# Patient Record
Sex: Male | Born: 1955 | Race: White | Hispanic: No | Marital: Married | Smoking: Never smoker
Health system: Southern US, Community
[De-identification: ages and names within clinical notes are randomized; demographics above are authoritative.]

## PROBLEM LIST (undated history)

## (undated) DIAGNOSIS — I2510 Atherosclerotic heart disease of native coronary artery without angina pectoris: Secondary | ICD-10-CM

## (undated) DIAGNOSIS — I219 Acute myocardial infarction, unspecified: Secondary | ICD-10-CM

## (undated) DIAGNOSIS — E785 Hyperlipidemia, unspecified: Secondary | ICD-10-CM

## (undated) DIAGNOSIS — I519 Heart disease, unspecified: Secondary | ICD-10-CM

## (undated) HISTORY — PX: HERNIA REPAIR: SHX51

## (undated) HISTORY — DX: Acute myocardial infarction, unspecified: I21.9

---

## 1999-09-23 ENCOUNTER — Encounter: Payer: Self-pay | Admitting: Emergency Medicine

## 1999-09-23 ENCOUNTER — Emergency Department (HOSPITAL_COMMUNITY): Admission: EM | Admit: 1999-09-23 | Discharge: 1999-09-23 | Payer: Self-pay | Admitting: Emergency Medicine

## 2015-09-19 ENCOUNTER — Emergency Department (HOSPITAL_COMMUNITY): Payer: BLUE CROSS/BLUE SHIELD

## 2015-09-19 ENCOUNTER — Encounter (HOSPITAL_COMMUNITY)
Admission: EM | Disposition: A | Discharge status: Home / Self Care | Payer: Self-pay | Source: Home / Self Care | Attending: Cardiovascular Disease

## 2015-09-19 ENCOUNTER — Encounter (HOSPITAL_COMMUNITY): Payer: Self-pay | Admitting: Emergency Medicine

## 2015-09-19 ENCOUNTER — Other Ambulatory Visit: Payer: Self-pay

## 2015-09-19 ENCOUNTER — Inpatient Hospital Stay (HOSPITAL_COMMUNITY)
Admission: EM | Admit: 2015-09-19 | Discharge: 2015-09-21 | DRG: 247 | Disposition: A | Discharge status: Home / Self Care | Payer: BLUE CROSS/BLUE SHIELD | Attending: Cardiovascular Disease | Admitting: Cardiovascular Disease

## 2015-09-19 ENCOUNTER — Other Ambulatory Visit (HOSPITAL_COMMUNITY): Payer: BLUE CROSS/BLUE SHIELD

## 2015-09-19 DIAGNOSIS — Z823 Family history of stroke: Secondary | ICD-10-CM | POA: Diagnosis not present

## 2015-09-19 DIAGNOSIS — Z8249 Family history of ischemic heart disease and other diseases of the circulatory system: Secondary | ICD-10-CM | POA: Diagnosis not present

## 2015-09-19 DIAGNOSIS — I519 Heart disease, unspecified: Secondary | ICD-10-CM | POA: Diagnosis present

## 2015-09-19 DIAGNOSIS — I2102 ST elevation (STEMI) myocardial infarction involving left anterior descending coronary artery: Secondary | ICD-10-CM | POA: Diagnosis present

## 2015-09-19 DIAGNOSIS — E785 Hyperlipidemia, unspecified: Secondary | ICD-10-CM | POA: Diagnosis present

## 2015-09-19 DIAGNOSIS — I2510 Atherosclerotic heart disease of native coronary artery without angina pectoris: Secondary | ICD-10-CM | POA: Diagnosis present

## 2015-09-19 DIAGNOSIS — R079 Chest pain, unspecified: Secondary | ICD-10-CM | POA: Diagnosis present

## 2015-09-19 DIAGNOSIS — I213 ST elevation (STEMI) myocardial infarction of unspecified site: Secondary | ICD-10-CM

## 2015-09-19 HISTORY — DX: Heart disease, unspecified: I51.9

## 2015-09-19 HISTORY — DX: Hyperlipidemia, unspecified: E78.5

## 2015-09-19 HISTORY — DX: Atherosclerotic heart disease of native coronary artery without angina pectoris: I25.10

## 2015-09-19 HISTORY — PX: CARDIAC CATHETERIZATION: SHX172

## 2015-09-19 LAB — BASIC METABOLIC PANEL
ANION GAP: 10 (ref 5–15)
BUN: 17 mg/dL (ref 6–20)
CHLORIDE: 105 mmol/L (ref 101–111)
CO2: 26 mmol/L (ref 22–32)
Calcium: 10 mg/dL (ref 8.9–10.3)
Creatinine, Ser: 0.97 mg/dL (ref 0.61–1.24)
GFR calc Af Amer: >60 mL/min (ref >60)
GLUCOSE: 115 mg/dL — AB (ref 65–99)
POTASSIUM: 3.7 mmol/L (ref 3.5–5.1)
Sodium: 141 mmol/L (ref 135–145)

## 2015-09-19 LAB — I-STAT CHEM 8, ED
BUN: 22 mg/dL — AB (ref 6–20)
CHLORIDE: 107 mmol/L (ref 101–111)
CREATININE: 0.9 mg/dL (ref 0.61–1.24)
Calcium, Ion: 1.07 mmol/L — ABNORMAL LOW (ref 1.12–1.23)
GLUCOSE: 112 mg/dL — AB (ref 65–99)
HEMATOCRIT: 49 % (ref 39.0–52.0)
HEMOGLOBIN: 16.7 g/dL (ref 13.0–17.0)
POTASSIUM: 5 mmol/L (ref 3.5–5.1)
Sodium: 138 mmol/L (ref 135–145)
TCO2: 23 mmol/L (ref 0–100)

## 2015-09-19 LAB — CBC WITH DIFFERENTIAL/PLATELET
BASOS ABS: 0.1 10*3/uL (ref 0.0–0.1)
Basophils Relative: 1 %
EOS PCT: 3 %
Eosinophils Absolute: 0.3 10*3/uL (ref 0.0–0.7)
HEMATOCRIT: 45.1 % (ref 39.0–52.0)
Hemoglobin: 16.4 g/dL (ref 13.0–17.0)
LYMPHS ABS: 3.1 10*3/uL (ref 0.7–4.0)
LYMPHS PCT: 41 %
MCH: 34.7 pg — AB (ref 26.0–34.0)
MCHC: 36.4 g/dL — ABNORMAL HIGH (ref 30.0–36.0)
MCV: 95.3 fL (ref 78.0–100.0)
Monocytes Absolute: 0.6 10*3/uL (ref 0.1–1.0)
Monocytes Relative: 7 %
NEUTROS ABS: 3.6 10*3/uL (ref 1.7–7.7)
Neutrophils Relative %: 48 %
PLATELETS: 221 10*3/uL (ref 150–400)
RBC: 4.73 MIL/uL (ref 4.22–5.81)
RDW: 12.7 % (ref 11.5–15.5)
WBC: 7.6 10*3/uL (ref 4.0–10.5)

## 2015-09-19 LAB — MRSA PCR SCREENING: MRSA by PCR: NEGATIVE

## 2015-09-19 LAB — TROPONIN I: Troponin I: <0.03 ng/mL (ref <0.031)

## 2015-09-19 LAB — I-STAT TROPONIN, ED: TROPONIN I, POC: 0.01 ng/mL (ref 0.00–0.08)

## 2015-09-19 LAB — PROTIME-INR
INR: 1.13 (ref 0.00–1.49)
Prothrombin Time: 14.7 seconds (ref 11.6–15.2)

## 2015-09-19 LAB — APTT: APTT: 25 s (ref 24–37)

## 2015-09-19 SURGERY — LEFT HEART CATH AND CORONARY ANGIOGRAPHY

## 2015-09-19 MED ORDER — ASPIRIN 81 MG PO CHEW
324.0000 mg | CHEWABLE_TABLET | Freq: Once | ORAL | Status: AC
  Administered 2015-09-19: 324 mg via ORAL
  Filled 2015-09-19: qty 4

## 2015-09-19 MED ORDER — SODIUM CHLORIDE 0.9 % IV SOLN
INTRAVENOUS | Status: DC
  Administered 2015-09-19: 1000 mL via INTRAVENOUS

## 2015-09-19 MED ORDER — VERAPAMIL HCL 2.5 MG/ML IV SOLN
INTRAVENOUS | Status: DC | PRN
  Administered 2015-09-19: 11:00:00 via INTRA_ARTERIAL

## 2015-09-19 MED ORDER — NITROGLYCERIN 1 MG/10 ML FOR IR/CATH LAB
INTRA_ARTERIAL | Status: DC | PRN
  Administered 2015-09-19: 11:00:00

## 2015-09-19 MED ORDER — MIDAZOLAM HCL 2 MG/2ML IJ SOLN
INTRAMUSCULAR | Status: DC | PRN
  Administered 2015-09-19: 1 mg via INTRAVENOUS

## 2015-09-19 MED ORDER — SODIUM CHLORIDE 0.9 % IV SOLN: 250.0000 mL | INTRAVENOUS | Status: DC | PRN

## 2015-09-19 MED ORDER — PRASUGREL HCL 10 MG PO TABS
10.0000 mg | ORAL_TABLET | Freq: Every day | ORAL | Status: DC
  Administered 2015-09-19 – 2015-09-21 (×3): 10 mg via ORAL
  Filled 2015-09-19 (×3): qty 1

## 2015-09-19 MED ORDER — HEPARIN (PORCINE) IN NACL 2-0.9 UNIT/ML-% IJ SOLN
INTRAMUSCULAR | Status: AC
  Filled 2015-09-19: qty 500

## 2015-09-19 MED ORDER — PRASUGREL HCL 10 MG PO TABS
ORAL_TABLET | ORAL | Status: AC
  Filled 2015-09-19: qty 1

## 2015-09-19 MED ORDER — VERAPAMIL HCL 2.5 MG/ML IV SOLN
INTRAVENOUS | Status: AC
  Filled 2015-09-19: qty 2

## 2015-09-19 MED ORDER — SODIUM CHLORIDE 0.9 % IV SOLN
0.2500 mg/kg/h | INTRAVENOUS | Status: AC
  Filled 2015-09-19: qty 250

## 2015-09-19 MED ORDER — LIDOCAINE HCL (PF) 1 % IJ SOLN
INTRAMUSCULAR | Status: AC
  Filled 2015-09-19: qty 30

## 2015-09-19 MED ORDER — PRASUGREL HCL 10 MG PO TABS
ORAL_TABLET | ORAL | Status: DC | PRN
  Administered 2015-09-19: 60 mg via ORAL

## 2015-09-19 MED ORDER — BIVALIRUDIN 250 MG IV SOLR
INTRAVENOUS | Status: AC
  Filled 2015-09-19: qty 250

## 2015-09-19 MED ORDER — FENTANYL CITRATE (PF) 100 MCG/2ML IJ SOLN
INTRAMUSCULAR | Status: DC | PRN
  Administered 2015-09-19: 25 ug via INTRAVENOUS

## 2015-09-19 MED ORDER — SODIUM CHLORIDE 0.9 % IJ SOLN: 3.0000 mL | INTRAMUSCULAR | Status: DC | PRN

## 2015-09-19 MED ORDER — SODIUM CHLORIDE 0.9 % IV SOLN: INTRAVENOUS | Status: AC

## 2015-09-19 MED ORDER — SODIUM CHLORIDE 0.9 % IV SOLN
250.0000 mg | INTRAVENOUS | Status: DC | PRN
  Administered 2015-09-19: 1.75 mg/kg/h via INTRAVENOUS

## 2015-09-19 MED ORDER — HEPARIN (PORCINE) IN NACL 100-0.45 UNIT/ML-% IJ SOLN
INTRAMUSCULAR | Status: AC
  Filled 2015-09-19: qty 250

## 2015-09-19 MED ORDER — ATORVASTATIN CALCIUM 80 MG PO TABS
80.0000 mg | ORAL_TABLET | Freq: Every day | ORAL | Status: DC
  Administered 2015-09-19 – 2015-09-20 (×2): 80 mg via ORAL
  Filled 2015-09-19 (×2): qty 1

## 2015-09-19 MED ORDER — CARVEDILOL 3.125 MG PO TABS
3.1250 mg | ORAL_TABLET | Freq: Two times a day (BID) | ORAL | Status: DC
  Administered 2015-09-19 – 2015-09-21 (×4): 3.125 mg via ORAL
  Filled 2015-09-19 (×4): qty 1

## 2015-09-19 MED ORDER — NITROGLYCERIN IN D5W 200-5 MCG/ML-% IV SOLN
5.0000 ug/min | INTRAVENOUS | Status: DC
  Administered 2015-09-19: 10 ug/min via INTRAVENOUS
  Administered 2015-09-19: 5 ug/min via INTRAVENOUS

## 2015-09-19 MED ORDER — NITROGLYCERIN 1 MG/10 ML FOR IR/CATH LAB
INTRA_ARTERIAL | Status: AC
  Filled 2015-09-19: qty 10

## 2015-09-19 MED ORDER — BIVALIRUDIN BOLUS VIA INFUSION - CUPID
INTRAVENOUS | Status: DC | PRN
  Administered 2015-09-19: 62.925 mg via INTRAVENOUS

## 2015-09-19 MED ORDER — PRASUGREL HCL 10 MG PO TABS
ORAL_TABLET | ORAL | Status: AC
  Filled 2015-09-19: qty 5

## 2015-09-19 MED ORDER — FENTANYL CITRATE (PF) 100 MCG/2ML IJ SOLN
INTRAMUSCULAR | Status: AC
  Filled 2015-09-19: qty 4

## 2015-09-19 MED ORDER — SODIUM CHLORIDE 0.9 % IJ SOLN
3.0000 mL | Freq: Two times a day (BID) | INTRAMUSCULAR | Status: DC
  Administered 2015-09-19 – 2015-09-20 (×3): 3 mL via INTRAVENOUS

## 2015-09-19 MED ORDER — ASPIRIN 81 MG PO CHEW
81.0000 mg | CHEWABLE_TABLET | Freq: Every day | ORAL | Status: DC
  Administered 2015-09-19 – 2015-09-21 (×3): 81 mg via ORAL
  Filled 2015-09-19 (×3): qty 1

## 2015-09-19 MED ORDER — ACETAMINOPHEN 325 MG PO TABS: 650.0000 mg | ORAL_TABLET | ORAL | Status: DC | PRN

## 2015-09-19 MED ORDER — HEPARIN SODIUM (PORCINE) 5000 UNIT/ML IJ SOLN
60.0000 [IU]/kg | INTRAMUSCULAR | Status: AC
  Administered 2015-09-19: 4000 [IU] via INTRAVENOUS

## 2015-09-19 MED ORDER — NITROGLYCERIN IN D5W 200-5 MCG/ML-% IV SOLN
INTRAVENOUS | Status: AC
  Administered 2015-09-19: 10 ug/min via INTRAVENOUS
  Filled 2015-09-19: qty 250

## 2015-09-19 MED ORDER — MIDAZOLAM HCL 2 MG/2ML IJ SOLN
INTRAMUSCULAR | Status: AC
  Filled 2015-09-19: qty 4

## 2015-09-19 SURGICAL SUPPLY — 17 items
BALLN EMERGE MR 2.5X12 (BALLOONS) ×2
BALLN ~~LOC~~ EUPHORA RX 3.5X12 (BALLOONS) ×2
BALLOON EMERGE MR 2.5X12 (BALLOONS) IMPLANT
BALLOON ~~LOC~~ EUPHORA RX 3.5X12 (BALLOONS) IMPLANT
CATH HEARTRAIL 6F IL3.5 (CATHETERS) ×1 IMPLANT
CATH INFINITI 5FR ANG PIGTAIL (CATHETERS) ×2 IMPLANT
DEVICE RAD COMP TR BAND LRG (VASCULAR PRODUCTS) ×2 IMPLANT
GLIDESHEATH SLEND SS 6F .021 (SHEATH) ×2 IMPLANT
KIT ENCORE 26 ADVANTAGE (KITS) ×1 IMPLANT
KIT HEART LEFT (KITS) ×2 IMPLANT
PACK CARDIAC CATHETERIZATION (CUSTOM PROCEDURE TRAY) ×2 IMPLANT
STENT XIENCE ALPINE RX 3.25X15 (Permanent Stent) ×1 IMPLANT
SYR MEDRAD MARK V 150ML (SYRINGE) ×2 IMPLANT
TRANSDUCER W/STOPCOCK (MISCELLANEOUS) ×2 IMPLANT
TUBING CIL FLEX 10 FLL-RA (TUBING) ×2 IMPLANT
WIRE RUNTHROUGH .014X180CM (WIRE) ×1 IMPLANT
WIRE SAFE-T 1.5MM-J .035X260CM (WIRE) ×2 IMPLANT

## 2015-09-19 NOTE — Progress Notes (Signed)
Utilization review completed.  

## 2015-09-19 NOTE — ED Notes (Signed)
Transfer consent witnessed by two RN's S. Sayf Kerner RN, T. Vogler. RN.

## 2015-09-19 NOTE — ED Notes (Signed)
RN notified pt son, Naheem Mosco [phone], who spoke with patient. Pt Brother, Alontae Chaloux [phone] also notified.

## 2015-09-19 NOTE — ED Notes (Signed)
Pt c/o central and left cp radiating down left arm since 8000 am.

## 2015-09-19 NOTE — H&P (Signed)
History and Physical  Patient ID: Devin Gardner MRN: [id number] DOB/AGE: 1956-05-18 59 y.o. Admit date: 09/19/2015  Primary Care Physician: No primary care provider on file. Primary Cardiologist : New. He lives in [place].  HPI:  This is a 59 year old male with no previous cardiac history and no chronic medical conditions other than family history of coronary artery disease and stroke. He is not a smoker. He presented with acute onset of chest pain around 8:00 this morning described as heartburn initially and then became tightness feeling with significant weakness. He went to the emergency room at [facility]. ECG showed ST elevation in V1 and V2 with reciprocal changes in the inferior leads and some ST elevation in aVR. He was given aspirin, heparin and nitroglycerin with resolution of chest pain. Due to his EKG changes and symptoms, he was transferred for emergent cardiac catheterization.  Review of systems complete and found to be negative unless listed above   History reviewed. No pertinent past medical history.   Family history: His father had a stroke at the age of 69 and then ultimately died of myocardial infarction in his 69s.  Social History   Social History  . Marital Status: Married    Spouse Name: N/A  . Number of Children: N/A  . Years of Education: N/A   Occupational History  . Not on file.   Social History Main Topics  . Smoking status: Not on file  . Smokeless tobacco: Never Used  . Alcohol Use: Yes     Comment: 4-5 beers daily  . Drug Use: No  . Sexual Activity: Not on file   Other Topics Concern  . Not on file   Social History Narrative  . No narrative on file    Past Surgical History  Procedure Laterality Date  . Hernia repair       No prescriptions prior to admission    Physical Exam: Blood pressure 136/71, pulse 88, temperature 98.2 F (36.8 C), resp. rate 18, height 5\' 11"  (1.803 m), weight 185 lb (83.915 kg), SpO2 100 %.    Constitutional: He is oriented to person, place, and time. He appears well-developed and well-nourished. No distress.  HENT: No nasal discharge.  Head: Normocephalic and atraumatic.  Eyes: Pupils are equal and round.  No discharge. Neck: Normal range of motion. Neck supple. No JVD present. No thyromegaly present.  Cardiovascular: Normal rate, regular rhythm, normal heart sounds. Exam reveals no gallop and no friction rub. No murmur heard.  Pulmonary/Chest: Effort normal and breath sounds normal. No stridor. No respiratory distress. He has no wheezes. He has no rales. He exhibits no tenderness.  Abdominal: Soft. Bowel sounds are normal. He exhibits no distension. There is no tenderness. There is no rebound and no guarding.  Musculoskeletal: Normal range of motion. He exhibits no edema and no tenderness.  Neurological: He is alert and oriented to person, place, and time. Coordination normal.  Skin: Skin is warm and dry. No rash noted. He is not diaphoretic. No erythema. No pallor.  Psychiatric: He has a normal mood and affect. His behavior is normal. Judgment and thought content normal.        Labs:   Lab Results  Component Value Date   WBC 7.6 09/19/2015   HGB 16.4 09/19/2015   HCT 45.1 09/19/2015   MCV 95.3 09/19/2015   PLT 221 09/19/2015    Recent Labs Lab 09/19/15 0915  NA 141  K 3.7  CL 105  CO2 26  BUN 17  CREATININE 0.97  CALCIUM 10.0  GLUCOSE 115*   Lab Results  Component Value Date   TROPONINI <0.03 09/19/2015   No results found for: CHOL No results found for: HDL No results found for: LDLCALC No results found for: TRIG No results found for: CHOLHDL No results found for: LDLDIRECT    Radiology: Dg Chest Portable 1 View  09/19/2015  CLINICAL DATA:  Code STEMI. EXAM: PORTABLE CHEST 1 VIEW COMPARISON:  None. FINDINGS: Lungs are adequately inflated without focal consolidation or effusion. There is subtle prominence of the perihilar markings which may be due to  mild vascular congestion. Cardiomediastinal silhouette is within normal remainder of the exam is within normal. IMPRESSION: Possible minimal vascular congestion. Electronically Signed   By: Marin Olp M.D.   On: 09/19/2015 10:13    EKG: NSR with 2 mm of ST elevation in V1 and V2 and minor ST elevation in aVR with reciprocal ST depression in the inferior leads  ASSESSMENT AND PLAN:  1. Acute anterior ST elevation myocardial infarction: The patient was given aspirin and a bolus of heparin. I discussed the situation with him and recommended proceeding with emergent cardiac catheterization and possible coronary intervention. Further recommendations to follow after cardiac catheterization. Please note that the patient has minimal risk factors for coronary artery disease other than sex, age and family history. I am going to check fasting lipid profile and start him on high dose atorvastatin. Cardiac rehabilitation is recommended post hospital discharge.  Signed: Kathlyn Sacramento MD, [facility] 09/19/2015, 11:45 AM

## 2015-09-19 NOTE — ED Notes (Signed)
Pain 3/10, EMS leaving now.

## 2015-09-19 NOTE — ED Notes (Signed)
Called Cath Lab x2 to give report, no answer.

## 2015-09-19 NOTE — ED Provider Notes (Signed)
CSN: [id number]     Arrival date & time 09/19/15  0900 History   First MD Initiated Contact with Patient 09/19/15 0912     Chief Complaint  Patient presents with  . Chest Pain     (Consider location/radiation/quality/duration/timing/severity/associated sxs/prior Treatment) HPI  A LEVEL 5 CAVEAT PERTAINS DUE TO URGENT NEED FOR INTERVENTION.  Pt presents with c/o chest pain.  He states pain began approx 8am, pain is in left side of chest, radiating down left arm.  He feels nauseated and began to have cold sweats prior to leaving his house.  He has no cardiac history that he knows of, no other medical history that he is aware of.  He took an alleve prior to leaving for the ED.  Rates pain 9/10  History reviewed. No pertinent past medical history. Past Surgical History  Procedure Laterality Date  . Hernia repair     No family history on file. Social History  Substance Use Topics  . Smoking status: None  . Smokeless tobacco: Never Used  . Alcohol Use: Yes     Comment: 4-5 beers daily    Review of Systems  UNABLE TO OBTAIN ROS DUE TO LEVEL 5 CAVEAT    Allergies  Review of patient's allergies indicates no known allergies.  Home Medications   Prior to Admission medications   Not on File   BP 135/90 mmHg  Pulse 88  Temp(Src) 98.2 F (36.8 C)  Resp 19  Ht 5\' 11"  (1.803 m)  Wt 185 lb (83.915 kg)  BMI 25.81 kg/m2  SpO2 99%  Vitals reviewed Physical Exam  Physical Examination: General appearance - alert, uncomfortable appearing, and in no distress Mental status - alert, oriented to person, place, and time Eyes - no conjunctival injection, no scleral icterus Mouth - mucous membranes moist, pharynx normal without lesions Chest - clear to auscultation, no wheezes, rales or rhonchi, symmetric air entry Heart - normal rate, regular rhythm, normal S1, S2, no murmurs, rubs, clicks or gallops Abdomen - soft, nontender, nondistended, no masses or organomegaly Neurological -  alert, oriented, normal speech, moving all extremities Extremities - peripheral pulses normal, no pedal edema, no clubbing or cyanosis Skin - normal coloration and turgor, no rashes  ED Course  Procedures (including critical care time)  CRITICAL CARE Performed by: Threasa Beards Total critical care time: 40 Critical care time was exclusive of separately billable procedures and treating other patients. Critical care was necessary to treat or prevent imminent or life-threatening deterioration. Critical care was time spent personally by me on the following activities: development of treatment plan with patient and/or surrogate as well as nursing, discussions with consultants, evaluation of patient's response to treatment, examination of patient, obtaining history from patient or surrogate, ordering and performing treatments and interventions, ordering and review of laboratory studies, ordering and review of radiographic studies, pulse oximetry and re-evaluation of patient's condition. Labs Review Labs Reviewed  CBC WITH DIFFERENTIAL/PLATELET - Abnormal; Notable for the following:    MCH 34.7 (*)    MCHC 36.4 (*)    All other components within normal limits  BASIC METABOLIC PANEL  TROPONIN I  APTT  PROTIME-INR  I-STAT TROPOININ, ED    Imaging Review No results found. I have personally reviewed and evaluated these images and lab results as part of my medical decision-making.   EKG Interpretation None     ED ECG REPORT   Date: 09/19/2015  Rate: 88  Rhythm: normal sinus rhythm  QRS Axis: normal  Intervals:  PR prolonged  ST/T Wave abnormalities: ST elevations anteriorly and ST elevations laterally with inferior ST depressions  Conduction Disutrbances:first-degree A-V block   Narrative Interpretation:   Old EKG Reviewed: none available   MDM   Final diagnoses:  ST elevation myocardial infarction (STEMI), unspecified artery ([place])   Pt with chest pain radiating down left  arm, ekg with changes c/w stemi.  Asa, nitroglycerin drip and heparin started- pt transferred emergently to [facility]- seen notes below.  At time of transfer, patient hemodynamically stable, pain 3/10 down from 9/10  9:20 AM code stemi has been called, heparin and nitro drip is being hung now.  Awaiting call back from cardiology.   9:31 AM d/w Dr. Fletcher Anon, cardiology- pt has been accepted to [facility] as STEMI, EMS is here and getting ready to transport patient.    Alfonzo Beers, MD 09/19/15 1034

## 2015-09-19 NOTE — ED Notes (Signed)
Still no answer in cath lab.

## 2015-09-19 NOTE — ED Notes (Signed)
EMS transport here, waiting on portable x-ray.

## 2015-09-19 NOTE — ED Notes (Signed)
Patient pain 5/10, verbal order to increase nitro.

## 2015-09-20 ENCOUNTER — Inpatient Hospital Stay (HOSPITAL_COMMUNITY): Payer: BLUE CROSS/BLUE SHIELD

## 2015-09-20 ENCOUNTER — Encounter (HOSPITAL_COMMUNITY): Payer: Self-pay | Admitting: Cardiovascular Disease

## 2015-09-20 DIAGNOSIS — I2102 ST elevation (STEMI) myocardial infarction involving left anterior descending coronary artery: Principal | ICD-10-CM

## 2015-09-20 DIAGNOSIS — R079 Chest pain, unspecified: Secondary | ICD-10-CM

## 2015-09-20 LAB — BASIC METABOLIC PANEL
Anion gap: 8 (ref 5–15)
BUN: 14 mg/dL (ref 6–20)
CHLORIDE: 105 mmol/L (ref 101–111)
CO2: 28 mmol/L (ref 22–32)
Calcium: 9.7 mg/dL (ref 8.9–10.3)
Creatinine, Ser: 0.99 mg/dL (ref 0.61–1.24)
GFR calc non Af Amer: >60 mL/min (ref >60)
Glucose, Bld: 99 mg/dL (ref 65–99)
POTASSIUM: 4.4 mmol/L (ref 3.5–5.1)
SODIUM: 141 mmol/L (ref 135–145)

## 2015-09-20 LAB — CBC
HEMATOCRIT: 43.9 % (ref 39.0–52.0)
Hemoglobin: 15.4 g/dL (ref 13.0–17.0)
MCH: 33.8 pg (ref 26.0–34.0)
MCHC: 35.1 g/dL (ref 30.0–36.0)
MCV: 96.5 fL (ref 78.0–100.0)
Platelets: 200 10*3/uL (ref 150–400)
RBC: 4.55 MIL/uL (ref 4.22–5.81)
RDW: 12.9 % (ref 11.5–15.5)
WBC: 8 10*3/uL (ref 4.0–10.5)

## 2015-09-20 LAB — HEPATIC FUNCTION PANEL
ALT: 21 U/L (ref 17–63)
AST: 38 U/L (ref 15–41)
Albumin: 3.5 g/dL (ref 3.5–5.0)
Alkaline Phosphatase: 30 U/L — ABNORMAL LOW (ref 38–126)
BILIRUBIN DIRECT: 0.2 mg/dL (ref 0.1–0.5)
BILIRUBIN TOTAL: 1.1 mg/dL (ref 0.3–1.2)
Indirect Bilirubin: 0.9 mg/dL (ref 0.3–0.9)
Total Protein: 6.7 g/dL (ref 6.5–8.1)

## 2015-09-20 LAB — LIPID PANEL
Cholesterol: 235 mg/dL — ABNORMAL HIGH (ref 0–200)
HDL: 57 mg/dL (ref >40)
LDL CALC: 121 mg/dL — AB (ref 0–99)
Total CHOL/HDL Ratio: 4.1 RATIO
Triglycerides: 286 mg/dL — ABNORMAL HIGH (ref <150)
VLDL: 57 mg/dL — ABNORMAL HIGH (ref 0–40)

## 2015-09-20 LAB — POCT ACTIVATED CLOTTING TIME: ACTIVATED CLOTTING TIME: 706 s

## 2015-09-20 LAB — GLUCOSE, CAPILLARY: GLUCOSE-CAPILLARY: 115 mg/dL — AB (ref 65–99)

## 2015-09-20 NOTE — Progress Notes (Signed)
Patient ID: Devin Gardner, male   DOB: 01/06/1956, 59 y.o.   MRN: [id number]    Subjective:  Denies SSCP, palpitations or Dyspnea BM this am  Objective:  Filed Vitals:   09/20/15 0005 09/20/15 0300 09/20/15 0359 09/20/15 0750  BP: 115/53 107/62    Pulse:      Temp: 97.9 F (36.6 C)  98.1 F (36.7 C) 98.2 F (36.8 C)  TempSrc: Oral  Oral Oral  Resp:      Height:      Weight:      SpO2: 97% 98%      Intake/Output from previous day:  Intake/Output Summary (Last 24 hours) at 09/20/15 6834 Last data filed at 09/19/15 1700  Gross per 24 hour  Intake 1396.53 ml  Output   1900 ml  Net -503.47 ml    Physical Exam: Affect appropriate Healthy:  appears stated age HEENT: normal Neck supple with no adenopathy JVP normal no bruits no thyromegaly Lungs clear with no wheezing and good diaphragmatic motion Heart:  S1/S2 no murmur, no rub, gallop or click PMI normal Abdomen: benighn, BS positve, no tenderness, no AAA no bruit.  No HSM or HJR Distal pulses intact with no bruits No edema Neuro non-focal Skin warm and dry No muscular weakness Right radial cath sight A   Lab Results: Basic Metabolic Panel:  Recent Labs  09/19/15 0915 09/19/15 0917  NA 141 138  K 3.7 5.0  CL 105 107  CO2 26  --   GLUCOSE 115* 112*  BUN 17 22*  CREATININE 0.97 0.90  CALCIUM 10.0  --    CBC:  Recent Labs  09/19/15 0915 09/19/15 0917  WBC 7.6  --   NEUTROABS 3.6  --   HGB 16.4 16.7  HCT 45.1 49.0  MCV 95.3  --   PLT 221  --    Cardiac Enzymes:  Recent Labs  09/19/15 0915  TROPONINI <0.03    Imaging: Dg Chest Portable 1 View  09/19/2015  CLINICAL DATA:  Code STEMI. EXAM: PORTABLE CHEST 1 VIEW COMPARISON:  None. FINDINGS: Lungs are adequately inflated without focal consolidation or effusion. There is subtle prominence of the perihilar markings which may be due to mild vascular congestion. Cardiomediastinal silhouette is within normal remainder of the exam is within  normal. IMPRESSION: Possible minimal vascular congestion. Electronically Signed   By: Marin Olp M.D.   On: 09/19/2015 10:13    Cardiac Studies:  ECG:  Orders placed or performed during the hospital encounter of 09/19/15  . ED EKG  . ED EKG  . EKG 12-Lead  . EKG 12-Lead  . ED EKG  . ED EKG  . EKG 12-Lead  . EKG 12-Lead  . EKG 12-Lead immediately post procedure  . EKG 12-Lead  . EKG 12-Lead immediately post procedure  . EKG 12-Lead     Telemetry: NSR no VT  09/20/2015   Echo: pending   Medications:   . aspirin  81 mg Oral Daily  . atorvastatin  80 mg Oral q1800  . carvedilol  3.125 mg Oral BID WC  . prasugrel  10 mg Oral Daily  . sodium chloride  3 mL Intravenous Q12H     . sodium chloride 1,000 mL (09/19/15 0923)  . nitroGLYCERIN 10 mcg/min (09/19/15 0941)    Assessment/Plan:  SEMI:  99% ostial LAD.  Continue DAT and beta blocker post DES to ostial LAD  Echo pending but EF low normal on cath Chol:  On high dose  statin  Ambulate rehab to see D/C in am if no issues   Jenkins Rouge 09/20/2015, 8:08 AM

## 2015-09-20 NOTE — Progress Notes (Signed)
Order to transfer verified. Rm [id number] available, Pt informed and verbalized understanding. Report called to Gerald Stabs, Therapist, sports. Pt's belongings packed, Elink notified. Pt transported to 2W with belongings via wheelchair.

## 2015-09-20 NOTE — Progress Notes (Signed)
Patient lying in bed, no distress or pain. No needs expressed at this time. Call light within reach. 

## 2015-09-20 NOTE — Care Management Note (Signed)
Case Management Note  Patient Details  Name: DANEN LAPAGLIA MRN: [id number] Date of Birth: 1956-08-23  Subjective/Objective:     Adm w mi              Action/Plan: lives alone   Expected Discharge Date:                Expected Discharge Plan:  Home/Self Care  In-House Referral:     Discharge planning Services  CM Consult, Medication Assistance  Post Acute Care Choice:    Choice offered to:     DME Arranged:    DME Agency:     HH Arranged:    [place] Agency:     Status of Service:     Medicare Important Message Given:    Date Medicare IM Given:    Medicare IM give by:    Date Additional Medicare IM Given:    Additional Medicare Important Message give by:     If discussed at [place] of Stay Meetings, dates discussed:    Additional Comments:gave pt 30day free and 0 copay card for effeint. Pt has bcbs ins.  Lacretia Leigh, RN 09/20/2015, 9:33 AM

## 2015-09-20 NOTE — Progress Notes (Signed)
CARDIAC REHAB PHASE I   PRE:  Rate/Rhythm: 78 SR  BP:  Sitting: 149/84        SaO2: 98 RA  MODE:  Ambulation: 700 ft   POST:  Rate/Rhythm: 89 SR  BP:  Sitting: 155/81         SaO2: 98 RA  Pt ambulated 700 ft on RA, independent, steady gait, tolerated well.  Pt denies DOE, cp, dizziness, declined rest stop. Completed MI/stent education.  Reviewed risk factors, MI book, anti-platelet therapy, stent card, activity restrictions, ntg, exercise, heart healthy diet, and phase 2 cardiac rehab. Pt verbalized understanding. Pt states he drinks 4-5 beers each night, discussed reducing alcohol intake. Pt states he will try. Pt agrees to phase 2 cardiac rehab referral, will send to [facility]. Pt to recliner after walk, call bell within reach.   [phone]   Lenna Sciara, RN, BSN 09/20/2015 11:39 AM

## 2015-09-20 NOTE — Progress Notes (Signed)
  Echocardiogram 2D Echocardiogram has been performed.  Devin Gardner M 09/20/2015, 10:10 AM

## 2015-09-21 ENCOUNTER — Encounter (HOSPITAL_COMMUNITY): Payer: Self-pay | Admitting: Physician Assistant

## 2015-09-21 DIAGNOSIS — I519 Heart disease, unspecified: Secondary | ICD-10-CM | POA: Diagnosis present

## 2015-09-21 DIAGNOSIS — I2510 Atherosclerotic heart disease of native coronary artery without angina pectoris: Secondary | ICD-10-CM | POA: Diagnosis present

## 2015-09-21 DIAGNOSIS — E785 Hyperlipidemia, unspecified: Secondary | ICD-10-CM | POA: Diagnosis present

## 2015-09-21 MED ORDER — ATORVASTATIN CALCIUM 80 MG PO TABS: 80.0000 mg | ORAL_TABLET | Freq: Every day | ORAL | Status: DC

## 2015-09-21 MED ORDER — ASPIRIN 81 MG PO CHEW: 81.0000 mg | CHEWABLE_TABLET | Freq: Every day | ORAL | Status: AC

## 2015-09-21 MED ORDER — CARVEDILOL 3.125 MG PO TABS: 3.1250 mg | ORAL_TABLET | Freq: Two times a day (BID) | ORAL | Status: DC

## 2015-09-21 MED ORDER — PRASUGREL HCL 10 MG PO TABS: 10.0000 mg | ORAL_TABLET | Freq: Every day | ORAL | Status: DC

## 2015-09-21 NOTE — Progress Notes (Signed)
Patient ID: Devin Gardner, male   DOB: February 08, 1956, 59 y.o.   MRN: [id number]    Subjective:  Denies SSCP, palpitations or Dyspnea Ready to go home   Objective:  Filed Vitals:   09/20/15 1155 09/20/15 2000 09/21/15 0505 09/21/15 0539  BP: 137/78 129/80 96/53 113/68  Pulse: 68 58 63 58  Temp: 97.5 F (36.4 C) 98.2 F (36.8 C) 98.7 F (37.1 C)   TempSrc: Oral Oral Oral   Resp: 18 18 18    Height:      Weight:      SpO2: 98% 99% 96%     Intake/Output from previous day: No intake or output data in the 24 hours ending 09/21/15 0820  Physical Exam: Affect appropriate Healthy:  appears stated age 27: normal Neck supple with no adenopathy JVP normal no bruits no thyromegaly Lungs clear with no wheezing and good diaphragmatic motion Heart:  S1/S2 no murmur, no rub, gallop or click PMI normal Abdomen: benighn, BS positve, no tenderness, no AAA no bruit.  No HSM or HJR Distal pulses intact with no bruits No edema Neuro non-focal Skin warm and dry No muscular weakness Right radial cath sight A   Lab Results: Basic Metabolic Panel:  Recent Labs  09/19/15 0915 09/19/15 0917 09/20/15 0220  NA 141 138 141  K 3.7 5.0 4.4  CL 105 107 105  CO2 26  --  28  GLUCOSE 115* 112* 99  BUN 17 22* 14  CREATININE 0.97 0.90 0.99  CALCIUM 10.0  --  9.7   CBC:  Recent Labs  09/19/15 0915 09/19/15 0917 09/20/15 0220  WBC 7.6  --  8.0  NEUTROABS 3.6  --   --   HGB 16.4 16.7 15.4  HCT 45.1 49.0 43.9  MCV 95.3  --  96.5  PLT 221  --  200   Cardiac Enzymes:  Recent Labs  09/19/15 0915  TROPONINI <0.03    Imaging: Dg Chest Portable 1 View  09/19/2015  CLINICAL DATA:  Code STEMI. EXAM: PORTABLE CHEST 1 VIEW COMPARISON:  None. FINDINGS: Lungs are adequately inflated without focal consolidation or effusion. There is subtle prominence of the perihilar markings which may be due to mild vascular congestion. Cardiomediastinal silhouette is within normal remainder of the  exam is within normal. IMPRESSION: Possible minimal vascular congestion. Electronically Signed   By: Marin Olp M.D.   On: 09/19/2015 10:13    Cardiac Studies:  ECG:  SR lateral ST elevation    Telemetry: NSR no VT  09/21/2015   Echo: EF 45-50%  reviewed  Medications:   . aspirin  81 mg Oral Daily  . atorvastatin  80 mg Oral q1800  . carvedilol  3.125 mg Oral BID WC  . prasugrel  10 mg Oral Daily  . sodium chloride  3 mL Intravenous Q12H     . sodium chloride 1,000 mL (09/19/15 0923)  . nitroGLYCERIN 10 mcg/min (09/19/15 0941)    Assessment/Plan:  SEMI:  99% ostial LAD.  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

## 2015-09-21 NOTE — Progress Notes (Signed)
CARDIAC REHAB PHASE I   PRE:  Rate/Rhythm: 72 SR  BP:  Sitting: 138/75        SaO2: 98 RA  MODE:  Ambulation: 1100 ft   POST:  Rate/Rhythm: 76 SR  BP:  Sitting: 134/84         SaO2: 99 RA  Pt ambulated 1110 ft on RA, independent, steady gait, tolerated well. Pt denies CP, dizziness, DOE. Pt states he has not questions regarding education at this time, reinforced importance of medication compliance, activity restrictions, diet, exercise and reducing amount of daily alcohol consumption. Pt verbalized understanding. Pt ready for discharge, in chair, call bell within reach.  [phone]  Lenna Sciara, RN, BSN 09/21/2015 9:39 AM

## 2015-09-21 NOTE — Discharge Summary (Signed)
Discharge Summary   Patient ID: Devin Gardner MRN: [id number], DOB/AGE: 1955/12/07 59 y.o. Admit date: 09/19/2015 D/C date:     09/21/2015  Primary Cardiologist: Wants to follow up in [place] Problem:   ST elevation (STEMI) myocardial infarction involving left anterior descending coronary artery ([place]) Active Problems:   Left ventricular systolic dysfunction without heart failure   HLD (hyperlipidemia)   CAD (coronary artery disease)    Admission Dates: 09/19/15-09/21/15 Discharge Diagnosis: Ant STEMI s/p DES to ostial LAD  HPI: Devin Gardner is a 59 y.o. male with no past medical history who presented [facility] on 09/19/15 with chest pain and found to have anterior STEMI. She was transferred to [facility] for emergent cardiac catheterization.  He had no previous cardiac history and no chronic medical conditions other than family history of coronary artery disease and stroke. He is not a smoker. He presented with acute onset of chest pain around 8:00am, described as heartburn initially and then became tightness feeling with significant weakness. He went to the emergency room at [facility]. ECG showed ST elevation in V1 and V2 with reciprocal changes in the inferior leads and some ST elevation in aVR. He was given aspirin, heparin and nitroglycerin with resolution of chest pain. Due to his EKG changes and symptoms, he was transferred to [facility] for emergent cardiac catheterization.  Hospital Course  Anterior STEMI:  -- s/p LHC on 09/19/15 which revealed  1. Severe one-vessel coronary artery disease with 99% thrombotic stenosis in the proximal/ostial LAD. Otherwise mild disease affecting the RCA and LCX.   2. Low normal LV systolic function. Mildly elevated left ventricular end-diastolic pressure.  3. Successful angioplasty and DES placement to the LAD. -- 2D ECHO with EF 45-50%, diffuse HK, G1DD -- Continue DAPT w/ ASA/Effient for at least 1 year. Continue coreg 3.125m  BID and atorvastatin 832m  Chol:LDL 121. Goal <70. Continue high dose statin  Dispo- he will be discharged home today. He has been told not to work for a week. 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 After this, he can travel.   The patient has had an uncomplicated hospital course and is recovering well. The radial catheter site is stable. He  has been seen by Dr. NiJohnsie Canceloday and deemed ready for discharge home. All follow-up appointments have been scheduled. Smoking cessation was disscussed in length. Discharge medications are listed below.   Discharge Vitals: Blood pressure 113/68, pulse 58, temperature 98.7 F (37.1 C), temperature source Oral, resp. rate 18, height _0  (1.803 m), weight 200 lb 9.9 oz (91 kg), SpO2 96 %.  Labs: Lab Results  Component Value Date   WBC 8.0 09/20/2015   HGB 15.4 09/20/2015   HCT 43.9 09/20/2015   MCV 96.5 09/20/2015   PLT 200 09/20/2015     Recent Labs Lab 09/20/15 0220  NA 141  K 4.4  CL 105  CO2 28  BUN 14  CREATININE 0.99  CALCIUM 9.7  PROT 6.7  BILITOT 1.1  ALKPHOS 30*  ALT 21  AST 38  GLUCOSE 99    Recent Labs  09/19/15 0915  TROPONINI <0.03   Lab Results  Component Value Date   CHOL 235* 09/20/2015   HDL 57 09/20/2015   LDLCALC 121* 09/20/2015   TRIG 286* 09/20/2015     Diagnostic Studies/Procedures   Dg Chest Portable 1 View  09/19/2015  CLINICAL DATA:  Code STEMI. EXAM: PORTABLE CHEST 1 VIEW COMPARISON:  None. FINDINGS: Lungs are adequately inflated without focal consolidation or effusion. There is subtle prominence of the perihilar markings which may be due to mild vascular congestion. Cardiomediastinal silhouette is within normal remainder of the exam is within normal. IMPRESSION: Possible minimal vascular congestion. Electronically Signed   By: Marin Olp M.D.   On: 09/19/2015 10:13    LHC 09/19/15 Conclusion     The left ventricular systolic function is normal.  Prox RCA lesion, 20%  stenosed.  Mid LAD lesion, 40% stenosed.  Ost Cx lesion, 40% stenosed.  Ost LAD lesion, 99% stenosed. Post intervention, there is a 0% residual stenosis.  1. Severe one-vessel coronary artery disease with 99% thrombotic stenosis in the proximal/ostial LAD. Otherwise mild disease affecting the RCA and LCX.  2. Low normal LV systolic function. Mildly elevated left ventricular end-diastolic pressure. 3. Successful angioplasty and drug-eluting stent placement to the LAD.  Recommendations: Dual antiplatelet therapy for at least one year. Cardiac rehabilitation and usual post MI care.     Indications    ST elevation (STEMI) myocardial infarction involving left anterior descending coronary artery (HCC) [I21.02 (ICD-10-CM)]           Coronary Findings    Dominance: Right   Left Main  The vessel is angiographically normal.     Left Anterior Descending   . Ost LAD lesion, 99% stenosed. thrombotic.   Marland Kitchen PCI: The pre-interventional distal flow is normal (TIMI 3). Pre-stent angioplasty was performed. A drug-eluting stent was placed. The strut is apposed. Post-stent angioplasty was performed. Maximum pressure: 16 atm. The post-interventional distal flow is normal (TIMI 3). The intervention was successful. No complications occurred at this lesion.  . Supplies used: [employer] [id number]  . There is no residual stenosis post intervention.     . Mid LAD lesion, 40% stenosed.   . First Diagonal Branch   The vessel is angiographically normal.   . Second Diagonal Branch   The vessel is large in size and is angiographically normal.   . Third Diagonal Branch   The vessel is small in size and is angiographically normal.     Left Circumflex   . Ost Cx lesion, 40% stenosed. discrete.   . First Obtuse Marginal Branch   The vessel is small in size.   . Lateral First Obtuse Marginal Branch   The vessel is angiographically normal.   . Second Obtuse Marginal Branch   The vessel  is small in size and is angiographically normal.   . Third Obtuse Marginal Branch   The vessel is angiographically normal.     Right Coronary Artery   . Prox RCA lesion, 20% stenosed.   . Right Posterior Descending Artery   The vessel exhibits minimal luminal irregularities.   . Right Posterior Atrioventricular Branch   The vessel exhibits minimal luminal irregularities.   . First Right Posterolateral   The vessel is small in size and is angiographically normal.   . Second Right Posterolateral   The vessel is large in size and is angiographically normal.   . Third Right Posterolateral   The vessel is small in size and exhibits minimal luminal irregularities.      Wall Motion                 Left Heart    Left Ventricle The left ventricular size is normal. The left ventricular systolic function is normal. There are no wall motion abnormalities in the left ventricle.   Mitral Valve  There is no mitral valve regurgitation.   Aortic Valve There is no aortic valve stenosis.    Coronary Diagrams    Diagnostic Diagram           Post-Intervention Diagram           2D ECHO: 09/20/2015 LV EF: 45- 50% Study Conclusions - Left ventricle: The cavity size was normal. There was mild focal basal hypertrophy of the septum. Systolic function was mildly reduced. The estimated ejection fraction was in the range of 45% to 50%. Diffuse hypokinesis. There was an increased relative contribution of atrial contraction to ventricular filling. Doppler parameters are consistent with abnormal left ventricular relaxation (grade 1 diastolic dysfunction).    Discharge Medications     Medication List    STOP taking these medications        naproxen sodium 220 MG tablet  Commonly known as:  ANAPROX      TAKE these medications        aspirin 81 MG chewable tablet  Chew 1 tablet (81 mg total) by mouth daily.     atorvastatin 80 MG tablet  Commonly known  as:  LIPITOR  Take 1 tablet (80 mg total) by mouth daily at 6 PM.     carvedilol 3.125 MG tablet  Commonly known as:  COREG  Take 1 tablet (3.125 mg total) by mouth 2 (two) times daily with a meal.     prasugrel 10 MG Tabs tablet  Commonly known as:  EFFIENT  Take 1 tablet (10 mg total) by mouth daily.        Disposition   The patient will be discharged in stable condition to home. Discharge Instructions    Amb Referral to Cardiac Rehabilitation    Complete by:  As directed   Diagnosis:   Myocardial Infarction PCI            Follow-up Information    Follow up with Jory Sims, NP On 09/28/2015.   Specialties:  Nurse Practitioner, Radiology, Cardiology   Why:  @ 1:50pm   Contact information:   [place] [address] [phone]         Duration of Discharge Encounter: Greater than 30 minutes including physician and PA time.  SignedGrandville Silos, Francile Woolford R PA-C 09/21/2015, 11:06 AM

## 2015-09-28 ENCOUNTER — Ambulatory Visit (INDEPENDENT_AMBULATORY_CARE_PROVIDER_SITE_OTHER): Payer: BLUE CROSS/BLUE SHIELD | Admitting: Adult Health

## 2015-09-28 ENCOUNTER — Encounter: Payer: Self-pay | Admitting: Adult Health

## 2015-09-28 VITALS — BP 122/74 | HR 78 | Ht 71.0 in | Wt 189.0 lb

## 2015-09-28 DIAGNOSIS — I2510 Atherosclerotic heart disease of native coronary artery without angina pectoris: Secondary | ICD-10-CM | POA: Diagnosis not present

## 2015-09-28 DIAGNOSIS — E785 Hyperlipidemia, unspecified: Secondary | ICD-10-CM

## 2015-09-28 DIAGNOSIS — E7800 Pure hypercholesterolemia, unspecified: Secondary | ICD-10-CM | POA: Diagnosis not present

## 2015-09-28 NOTE — Progress Notes (Signed)
Cardiology Office Note   Date:  09/28/2015   ID:  Devin Gardner, DOB June 01, 1956, MRN [id number]  PCP:  No PCP Per Patient  Cardiologist:  To Be est with Dr. Cloria Spring, NP   Chief Complaint  Patient presents with  . Coronary Artery Disease    S/P DES to LAD  . Hyperlipidemia      History of Present Illness: Devin Gardner is a 59 y.o. male who presents for post hospital follow up after admission to [facility] for Anterior STEMI of the LAD. He had no previous cardiac history but presented with acute chest pain described as severe heartburn. Came to [facility] with EKG revealing  ST elevation in V1 and V2 with reciprocal changes in the inferior leads and some ST elevation in aVR. Emergent cardiac cath demonstrated  on 09/19/15 which revealed 1. Severe one-vessel coronary artery disease with 99% thrombotic stenosis in the proximal/ostial LAD. Otherwise mild disease affecting the RCA and LCX.  2. Low normal LV systolic function. Mildly elevated left ventricular end-diastolic pressure. 3. Successful angioplasty and DES placement to the LAD. -- 2D ECHO with EF 45-50%, diffuse HK, G1DD -- Continue DAPT w/ ASA/Effient for at least 1 year. Continue coreg 3.125mg  BID and atorvastatin 80mg    He comes today feeling very well with the exception of some gout symptoms in his right hand. He is tolerating the medications and has not bleeding or myalgia.    Past Medical History  Diagnosis Date  . CAD (coronary artery disease)     a. 09/19/15 ant STEMI s/p DES to ost LAD. Otherwise mild disease affecting the RCA and LCX.   Marland Kitchen HLD (hyperlipidemia)   . Left ventricular systolic dysfunction without heart failure     a. mild. 08/2015 2D ECHO with EF 45-50%, diffuse HK, G1DD    Past Surgical History  Procedure Laterality Date  . Hernia repair    . No past surgeries    . Cardiac catheterization N/A 09/19/2015    Procedure: Left Heart Cath and Coronary  Angiography;  Surgeon: Wellington Hampshire, MD;  Location: [place] [facility];  Service: Cardiovascular;  Laterality: N/A;  . Cardiac catheterization N/A 09/19/2015    Procedure: Coronary Stent Intervention;  Surgeon: Wellington Hampshire, MD;  Location: [place] [facility];  Service: Cardiovascular;  Laterality: N/A;     Current Outpatient Prescriptions  Medication Sig Dispense Refill  . aspirin 81 MG chewable tablet Chew 1 tablet (81 mg total) by mouth daily.    Marland Kitchen atorvastatin (LIPITOR) 80 MG tablet Take 1 tablet (80 mg total) by mouth daily at 6 PM. 30 tablet 11  . carvedilol (COREG) 3.125 MG tablet Take 1 tablet (3.125 mg total) by mouth 2 (two) times daily with a meal. 60 tablet 3  . prasugrel (EFFIENT) 10 MG TABS tablet Take 1 tablet (10 mg total) by mouth daily. 30 tablet 11   No current facility-administered medications for this visit.    Allergies:   Review of patient's allergies indicates no known allergies.    Social History:  The patient  reports that he has never smoked. He has never used smokeless tobacco. He reports that he drinks alcohol. He reports that he does not use illicit drugs.   Family History:  The patient's family history is not on file.    ROS: All other systems are reviewed and negative. Unless otherwise mentioned in H&P    PHYSICAL EXAM: VS:  BP 122/74 mmHg  Pulse 78  Ht 5\' 11"  (1.803 m)  Wt 189 lb (85.73 kg)  BMI 26.37 kg/m2  SpO2 97% , BMI Body mass index is 26.37 kg/(m^2). GEN: Well nourished, well developed, in no acute distress HEENT: normal Neck: no JVD, carotid bruits, or masses Cardiac: RRR; no murmurs, rubs, or gallops,no edema  Respiratory:  Clear to auscultation bilaterally, normal work of breathing GI: soft, nontender, nondistended, + BS MS: no deformity or atrophy Skin: warm and dry, no rash Neuro:  Strength and sensation are intact Psych: euthymic mood, full affect  Recent Labs: 09/20/2015: ALT 21; BUN 14; Creatinine, Ser 0.99;  Hemoglobin 15.4; Platelets 200; Potassium 4.4; Sodium 141    Lipid Panel    Component Value Date/Time   CHOL 235* 09/20/2015 0220   TRIG 286* 09/20/2015 0220   HDL 57 09/20/2015 0220   CHOLHDL 4.1 09/20/2015 0220   VLDL 57* 09/20/2015 0220   LDLCALC 121* 09/20/2015 0220      Wt Readings from Last 3 Encounters:  09/28/15 189 lb (85.73 kg)  09/19/15 200 lb 9.9 oz (91 kg)    ASSESSMENT AND PLAN:  1. CAD: S/P STEMI of the proximal LAD with DES placement. He is asymptomatic for recurrent chest pain. He is adherent to medical regimen. I have spoken at length about medications and location of stent placement. He is given a copy of his cath report. He will continue on DAPT and statin. He refuses cardiac rehab.   2. Hypercholesterolemia: Continue atorvastatin. He will have follow up lipids and LFTS.      Current medicines are reviewed at length with the patient today.    Labs/ tests ordered today include: Lipids in 3 months.l   Orders Placed This Encounter  Procedures  . Lipid panel  . Hepatic function panel     Disposition:   FU with 3 months with Dr. Harl Bowie to be established.    Signed, Jory Sims, NP  09/28/2015 2:36 PM    [facility] [address] [place], [place] [address] Phone: [phone]; Fax: [phone]

## 2015-09-28 NOTE — Patient Instructions (Signed)
Your physician wants you to follow-up in: 3 months with Dr. Harl Bowie. You will receive a reminder letter in the mail two months in advance. If you don't receive a letter, please call our office to schedule the follow-up appointment.  Your physician recommends that you return for lab work in: 3 months (Lipids, LFT's)  Your physician recommends that you continue on your current medications as directed. Please refer to the Current Medication list given to you today.  If you need a refill on your cardiac medications before your next appointment, please call your pharmacy.  Thank you for choosing [facility]!

## 2015-09-28 NOTE — Progress Notes (Signed)
Name: Devin Gardner    DOB: 1956/03/04  Age: 59 y.o.  MR#: [id number]       PCP:  No PCP Per Patient      Insurance: Payor: [place] / Plan: BCBS OTHER / Product Type: *No Product type* /   CC:   No chief complaint on file.   VS Filed Vitals:   09/28/15 1357  BP: 122/74  Pulse: 78  Height: 5\' 11"  (1.803 m)  Weight: 189 lb (85.73 kg)  SpO2: 97%    Weights Current Weight  09/28/15 189 lb (85.73 kg)  09/19/15 200 lb 9.9 oz (91 kg)    Blood Pressure  BP Readings from Last 3 Encounters:  09/28/15 122/74  09/21/15 113/68     Admit date:  (Not on file) Last encounter with RMR:  Visit date not found   Allergy Review of patient's allergies indicates no known allergies.  Current Outpatient Prescriptions  Medication Sig Dispense Refill  . aspirin 81 MG chewable tablet Chew 1 tablet (81 mg total) by mouth daily.    Marland Kitchen atorvastatin (LIPITOR) 80 MG tablet Take 1 tablet (80 mg total) by mouth daily at 6 PM. 30 tablet 11  . carvedilol (COREG) 3.125 MG tablet Take 1 tablet (3.125 mg total) by mouth 2 (two) times daily with a meal. 60 tablet 3  . prasugrel (EFFIENT) 10 MG TABS tablet Take 1 tablet (10 mg total) by mouth daily. 30 tablet 11   No current facility-administered medications for this visit.    Discontinued Meds:   There are no discontinued medications.  Patient Active Problem List   Diagnosis Date Noted  . Left ventricular systolic dysfunction without heart failure   . HLD (hyperlipidemia)   . CAD (coronary artery disease)   . ST elevation (STEMI) myocardial infarction involving left anterior descending coronary artery (HCC) 09/19/2015    LABS    Component Value Date/Time   NA 141 09/20/2015 0220   NA 138 09/19/2015 0917   NA 141 09/19/2015 0915   K 4.4 09/20/2015 0220   K 5.0 09/19/2015 0917   K 3.7 09/19/2015 0915   CL 105 09/20/2015 0220   CL 107 09/19/2015 0917   CL 105 09/19/2015 0915   CO2 28 09/20/2015 0220   CO2 26 09/19/2015 0915   GLUCOSE 99 09/20/2015 0220   GLUCOSE 112* 09/19/2015 0917   GLUCOSE 115* 09/19/2015 0915   BUN 14 09/20/2015 0220   BUN 22* 09/19/2015 0917   BUN 17 09/19/2015 0915   CREATININE 0.99 09/20/2015 0220   CREATININE 0.90 09/19/2015 0917   CREATININE 0.97 09/19/2015 0915   CALCIUM 9.7 09/20/2015 0220   CALCIUM 10.0 09/19/2015 0915   GFRNONAA >60 09/20/2015 0220   GFRNONAA >60 09/19/2015 0915   GFRAA >60 09/20/2015 0220   GFRAA >60 09/19/2015 0915   CMP     Component Value Date/Time   NA 141 09/20/2015 0220   K 4.4 09/20/2015 0220   CL 105 09/20/2015 0220   CO2 28 09/20/2015 0220   GLUCOSE 99 09/20/2015 0220   BUN 14 09/20/2015 0220   CREATININE 0.99 09/20/2015 0220   CALCIUM 9.7 09/20/2015 0220   PROT 6.7 09/20/2015 0220   ALBUMIN 3.5 09/20/2015 0220   AST 38 09/20/2015 0220   ALT 21 09/20/2015 0220   ALKPHOS 30* 09/20/2015 0220   BILITOT 1.1 09/20/2015 0220   GFRNONAA >60 09/20/2015 0220   GFRAA >60 09/20/2015 0220       Component Value  Date/Time   WBC 8.0 09/20/2015 0220   WBC 7.6 09/19/2015 0915   HGB 15.4 09/20/2015 0220   HGB 16.7 09/19/2015 0917   HGB 16.4 09/19/2015 0915   HCT 43.9 09/20/2015 0220   HCT 49.0 09/19/2015 0917   HCT 45.1 09/19/2015 0915   MCV 96.5 09/20/2015 0220   MCV 95.3 09/19/2015 0915    Lipid Panel     Component Value Date/Time   CHOL 235* 09/20/2015 0220   TRIG 286* 09/20/2015 0220   HDL 57 09/20/2015 0220   CHOLHDL 4.1 09/20/2015 0220   VLDL 57* 09/20/2015 0220   LDLCALC 121* 09/20/2015 0220    ABG    Component Value Date/Time   TCO2 23 09/19/2015 0917     No results found for: TSH BNP (last 3 results) No results for input(s): BNP in the last 8760 hours.  ProBNP (last 3 results) No results for input(s): PROBNP in the last 8760 hours.  Cardiac Panel (last 3 results) No results for input(s): CKTOTAL, CKMB, TROPONINI, RELINDX in the last 72 hours.  Iron/TIBC/Ferritin/ %Sat No results found for: IRON, TIBC, FERRITIN,  IRONPCTSAT   EKG Orders placed or performed during the hospital encounter of 09/19/15  . ED EKG  . ED EKG  . EKG 12-Lead  . EKG 12-Lead  . ED EKG  . ED EKG  . EKG 12-Lead  . EKG 12-Lead  . EKG 12-Lead immediately post procedure  . EKG 12-Lead  . EKG 12-Lead immediately post procedure  . EKG 12-Lead  . EKG     Prior Assessment and Plan Problem List as of 09/28/2015      Cardiovascular and Mediastinum   ST elevation (STEMI) myocardial infarction involving left anterior descending coronary artery (HCC)   Left ventricular systolic dysfunction without heart failure   CAD (coronary artery disease)     Other   HLD (hyperlipidemia)       Imaging: Dg Chest Portable 1 View  09/19/2015  CLINICAL DATA:  Code STEMI. EXAM: PORTABLE CHEST 1 VIEW COMPARISON:  None. FINDINGS: Lungs are adequately inflated without focal consolidation or effusion. There is subtle prominence of the perihilar markings which may be due to mild vascular congestion. Cardiomediastinal silhouette is within normal remainder of the exam is within normal. IMPRESSION: Possible minimal vascular congestion. Electronically Signed   By: Marin Olp M.D.   On: 09/19/2015 10:13

## 2015-11-02 ENCOUNTER — Encounter (INDEPENDENT_AMBULATORY_CARE_PROVIDER_SITE_OTHER): Payer: BLUE CROSS/BLUE SHIELD | Admitting: Adult Health

## 2015-11-02 ENCOUNTER — Encounter: Payer: Self-pay | Admitting: Adult Health

## 2015-11-02 VITALS — BP 158/74 | HR 60 | Ht 71.0 in | Wt 189.0 lb

## 2015-11-02 DIAGNOSIS — I255 Ischemic cardiomyopathy: Secondary | ICD-10-CM

## 2015-11-02 DIAGNOSIS — I25810 Atherosclerosis of coronary artery bypass graft(s) without angina pectoris: Secondary | ICD-10-CM | POA: Diagnosis not present

## 2015-11-02 DIAGNOSIS — I10 Essential (primary) hypertension: Secondary | ICD-10-CM

## 2015-11-02 NOTE — Progress Notes (Signed)
Name: Devin Gardner    DOB: [birth year]  Age: 59 y.o.  MR#: [id number]       PCP:  No PCP Per Patient      Insurance: Payor: [place] / Plan: BCBS OTHER / Product Type: *No Product type* /   CC:   No chief complaint on file.   VS Filed Vitals:   11/02/15 1524  BP: 158/74  Pulse: 60  Height: 5\' 11"  (1.803 m)  Weight: 189 lb (85.73 kg)  SpO2: 92%    Weights Current Weight  11/02/15 189 lb (85.73 kg)  09/28/15 189 lb (85.73 kg)  09/19/15 200 lb 9.9 oz (91 kg)    Blood Pressure  BP Readings from Last 3 Encounters:  11/02/15 158/74  09/28/15 122/74  09/21/15 113/68     Admit date:  (Not on file) Last encounter with RMR:  09/28/2015   Allergy Review of patient's allergies indicates no known allergies.  Current Outpatient Prescriptions  Medication Sig Dispense Refill  . aspirin 81 MG chewable tablet Chew 1 tablet (81 mg total) by mouth daily.    Marland Kitchen atorvastatin (LIPITOR) 80 MG tablet Take 1 tablet (80 mg total) by mouth daily at 6 PM. 30 tablet 11  . carvedilol (COREG) 3.125 MG tablet Take 1 tablet (3.125 mg total) by mouth 2 (two) times daily with a meal. 60 tablet 3  . prasugrel (EFFIENT) 10 MG TABS tablet Take 1 tablet (10 mg total) by mouth daily. 30 tablet 11   No current facility-administered medications for this visit.    Discontinued Meds:   There are no discontinued medications.  Patient Active Problem List   Diagnosis Date Noted  . Left ventricular systolic dysfunction without heart failure   . HLD (hyperlipidemia)   . CAD (coronary artery disease)   . ST elevation (STEMI) myocardial infarction involving left anterior descending coronary artery (HCC) 09/19/2015    LABS    Component Value Date/Time   NA 141 09/20/2015 0220   NA 138 09/19/2015 0917   NA 141 09/19/2015 0915   K 4.4 09/20/2015 0220   K 5.0 09/19/2015 0917   K 3.7 09/19/2015 0915   CL 105 09/20/2015 0220   CL 107 09/19/2015 0917   CL 105 09/19/2015 0915   CO2 28 09/20/2015  0220   CO2 26 09/19/2015 0915   GLUCOSE 99 09/20/2015 0220   GLUCOSE 112* 09/19/2015 0917   GLUCOSE 115* 09/19/2015 0915   BUN 14 09/20/2015 0220   BUN 22* 09/19/2015 0917   BUN 17 09/19/2015 0915   CREATININE 0.99 09/20/2015 0220   CREATININE 0.90 09/19/2015 0917   CREATININE 0.97 09/19/2015 0915   CALCIUM 9.7 09/20/2015 0220   CALCIUM 10.0 09/19/2015 0915   GFRNONAA >60 09/20/2015 0220   GFRNONAA >60 09/19/2015 0915   GFRAA >60 09/20/2015 0220   GFRAA >60 09/19/2015 0915   CMP     Component Value Date/Time   NA 141 09/20/2015 0220   K 4.4 09/20/2015 0220   CL 105 09/20/2015 0220   CO2 28 09/20/2015 0220   GLUCOSE 99 09/20/2015 0220   BUN 14 09/20/2015 0220   CREATININE 0.99 09/20/2015 0220   CALCIUM 9.7 09/20/2015 0220   PROT 6.7 09/20/2015 0220   ALBUMIN 3.5 09/20/2015 0220   AST 38 09/20/2015 0220   ALT 21 09/20/2015 0220   ALKPHOS 30* 09/20/2015 0220   BILITOT 1.1 09/20/2015 0220   GFRNONAA >60 09/20/2015 0220   GFRAA >60 09/20/2015 0220  Component Value Date/Time   WBC 8.0 09/20/2015 0220   WBC 7.6 09/19/2015 0915   HGB 15.4 09/20/2015 0220   HGB 16.7 09/19/2015 0917   HGB 16.4 09/19/2015 0915   HCT 43.9 09/20/2015 0220   HCT 49.0 09/19/2015 0917   HCT 45.1 09/19/2015 0915   MCV 96.5 09/20/2015 0220   MCV 95.3 09/19/2015 0915    Lipid Panel     Component Value Date/Time   CHOL 235* 09/20/2015 0220   TRIG 286* 09/20/2015 0220   HDL 57 09/20/2015 0220   CHOLHDL 4.1 09/20/2015 0220   VLDL 57* 09/20/2015 0220   LDLCALC 121* 09/20/2015 0220    ABG    Component Value Date/Time   TCO2 23 09/19/2015 0917     No results found for: TSH BNP (last 3 results) No results for input(s): BNP in the last 8760 hours.  ProBNP (last 3 results) No results for input(s): PROBNP in the last 8760 hours.  Cardiac Panel (last 3 results) No results for input(s): CKTOTAL, CKMB, TROPONINI, RELINDX in the last 72 hours.  Iron/TIBC/Ferritin/ %Sat No results  found for: IRON, TIBC, FERRITIN, IRONPCTSAT   EKG Orders placed or performed during the hospital encounter of 09/19/15  . ED EKG  . ED EKG  . EKG 12-Lead  . EKG 12-Lead  . ED EKG  . ED EKG  . EKG 12-Lead  . EKG 12-Lead  . EKG 12-Lead immediately post procedure  . EKG 12-Lead  . EKG 12-Lead immediately post procedure  . EKG 12-Lead  . EKG     Prior Assessment and Plan Problem List as of 11/02/2015      Cardiovascular and Mediastinum   ST elevation (STEMI) myocardial infarction involving left anterior descending coronary artery (HCC)   Left ventricular systolic dysfunction without heart failure   CAD (coronary artery disease)     Other   HLD (hyperlipidemia)       Imaging: No results found.

## 2015-11-02 NOTE — Progress Notes (Signed)
Cardiology Office Note   Date:  11/02/2015   ID:  Thuy, Isaiah May 06, 1956, MRN [id number]  PCP:  No PCP Per Patient  Cardiologist:  To BE EST Jory Sims, NP   No chief complaint on file.     History of Present Illness: Devin Gardner is a 59 y.o. male who presents for ongoing assessment and management of CAD, with Anterior STEMI of the LAD. He had no previous cardiac history but presented with acute chest pain described as severe heartburn. He was found to have severe one-vessel disease with 99% thrombotic stenosis of the proximal/ostial LAD. He underwent sucessful angioplasty and DES placement in the LAD. He was to follow up with Dr. Harl Bowie to established.   He came today as an add on because he had some chest discomfort. He Threw 40 35 lbs tires over a rail last week and began to have some chest soreness and Nausea and vomiting later that day.  He went home an rested and felt better. At the insistence of his family, he came to be checked out. He has not had any pain since.   Past Medical History  Diagnosis Date  . CAD (coronary artery disease)     a. 09/19/15 ant STEMI s/p DES to ost LAD. Otherwise mild disease affecting the RCA and LCX.   Marland Kitchen HLD (hyperlipidemia)   . Left ventricular systolic dysfunction without heart failure     a. mild. 08/2015 2D ECHO with EF 45-50%, diffuse HK, G1DD    Past Surgical History  Procedure Laterality Date  . Hernia repair    . No past surgeries    . Cardiac catheterization N/A 09/19/2015    Procedure: Left Heart Cath and Coronary Angiography;  Surgeon: Wellington Hampshire, MD;  Location: [facility];  Service: Cardiovascular;  Laterality: N/A;  . Cardiac catheterization N/A 09/19/2015    Procedure: Coronary Stent Intervention;  Surgeon: Wellington Hampshire, MD;  Location: [facility];  Service: Cardiovascular;  Laterality: N/A;     Current Outpatient Prescriptions  Medication Sig Dispense Refill  . aspirin 81 MG chewable  tablet Chew 1 tablet (81 mg total) by mouth daily.    Marland Kitchen atorvastatin (LIPITOR) 80 MG tablet Take 1 tablet (80 mg total) by mouth daily at 6 PM. 30 tablet 11  . carvedilol (COREG) 3.125 MG tablet Take 1 tablet (3.125 mg total) by mouth 2 (two) times daily with a meal. 60 tablet 3  . prasugrel (EFFIENT) 10 MG TABS tablet Take 1 tablet (10 mg total) by mouth daily. 30 tablet 11   No current facility-administered medications for this visit.    Allergies:   Review of patient's allergies indicates no known allergies.    Social History:  The patient  reports that he has never smoked. He has never used smokeless tobacco. He reports that he drinks alcohol. He reports that he does not use illicit drugs.   Family History:  The patient's family history is not on file.    ROS: All other systems are reviewed and negative. Unless otherwise mentioned in H&P    PHYSICAL EXAM: VS:  There were no vitals taken for this visit. , BMI There is no weight on file to calculate BMI. GEN: Well nourished, well developed, in no acute distress HEENT: normal Neck: no JVD, carotid bruits, or masses Cardiac: RRR; no murmurs, rubs, or gallops,no edema  Respiratory:  clear to auscultation bilaterally, normal work of breathing GI: soft, nontender, nondistended, +  BS MS: no deformity or atrophy Skin: warm and dry, no rash Neuro:  Strength and sensation are intact Psych: euthymic mood, full affect   EKG:  The ekg ordered today demonstrates NSR without evidence of changes indicative of ACS or ischemia.    Recent Labs: 09/20/2015: ALT 21; BUN 14; Creatinine, Ser 0.99; Hemoglobin 15.4; Platelets 200; Potassium 4.4; Sodium 141    Lipid Panel    Component Value Date/Time   CHOL 235* 09/20/2015 0220   TRIG 286* 09/20/2015 0220   HDL 57 09/20/2015 0220   CHOLHDL 4.1 09/20/2015 0220   VLDL 57* 09/20/2015 0220   LDLCALC 121* 09/20/2015 0220      Wt Readings from Last 3 Encounters:  09/28/15 189 lb (85.73 kg)   09/19/15 200 lb 9.9 oz (91 kg)       ASSESSMENT AND PLAN:  1.CAD: Multivessel, with Stent to the LAD. He had some heartburn and some nausea and vomiting after throwing the tires. He has felt better since that time without recurrent symptoms. EKG did not show any changes. He is advised no to do heavy lifting of that kind for another few months. He may have had some musculoskeletal pain but uncertain if his symptoms were cardiac related. He is advised to continue current medical regimen . He will be established with Dr. Harl Bowie on next visit.   2. Systolic Dysfunction: He had reduced EF after his MI.He  Is due to have repeat echo on next appointment.   3. Hypertension: Slightly elevated today. He was in a hurry to get here. He is not on ACE. Will need to consider this on follow up appointment or if PCP sees that his BP remains elevated.    Current medicines are reviewed at length with the patient today.    Labs/ tests ordered today include: None No orders of the defined types were placed in this encounter.     Disposition:   FU with Dr. Harl Bowie on previously scheduled appt.   Signed, Jory Sims, NP  11/02/2015 7:22 AM    [facility] [address] [place], [place] [address] Phone: [phone]; Fax: [phone]

## 2015-11-18 ENCOUNTER — Other Ambulatory Visit: Payer: Self-pay

## 2015-11-18 MED ORDER — ATORVASTATIN CALCIUM 80 MG PO TABS: 80.0000 mg | ORAL_TABLET | Freq: Every day | ORAL | Status: DC

## 2015-11-18 MED ORDER — CARVEDILOL 3.125 MG PO TABS: 3.1250 mg | ORAL_TABLET | Freq: Two times a day (BID) | ORAL | Status: DC

## 2015-11-23 ENCOUNTER — Other Ambulatory Visit: Payer: Self-pay

## 2015-11-23 MED ORDER — ATORVASTATIN CALCIUM 80 MG PO TABS: 80.0000 mg | ORAL_TABLET | Freq: Every day | ORAL | Status: DC

## 2015-11-23 MED ORDER — CARVEDILOL 3.125 MG PO TABS: 3.1250 mg | ORAL_TABLET | Freq: Two times a day (BID) | ORAL | Status: DC

## 2016-01-03 ENCOUNTER — Ambulatory Visit (INDEPENDENT_AMBULATORY_CARE_PROVIDER_SITE_OTHER): Payer: BLUE CROSS/BLUE SHIELD | Admitting: Cardiology

## 2016-01-03 ENCOUNTER — Encounter: Payer: Self-pay | Admitting: Cardiology

## 2016-01-03 VITALS — BP 138/100 | HR 65 | Ht 71.0 in | Wt 187.0 lb

## 2016-01-03 DIAGNOSIS — Z79899 Other long term (current) drug therapy: Secondary | ICD-10-CM | POA: Diagnosis not present

## 2016-01-03 MED ORDER — LISINOPRIL 2.5 MG PO TABS: 2.5000 mg | ORAL_TABLET | Freq: Every day | ORAL | Status: DC

## 2016-01-03 NOTE — Patient Instructions (Signed)
Medication Instructions:  START LISINIOPRIL 2.5 MG DAILY   Labwork: Your physician recommends that you return for lab work in: [place]  BMET   Testing/Procedures: NONE  Follow-Up: Your physician wants you to follow-up in: 6 MONTHS. You will receive a reminder letter in the mail two months in advance. If you don't receive a letter, please call our office to schedule the follow-up appointment.   Any Other Special Instructions Will Be Listed Below (If Applicable).     If you need a refill on your cardiac medications before your next appointment, please call your pharmacy.

## 2016-01-03 NOTE — Progress Notes (Signed)
Patient ID: Devin Gardner, male   DOB: 1956-03-27, 60 y.o.   MRN: [id number]     Clinical Summary Devin Gardner is a 60 y.o.male last seen by NP Purcell Nails, this is our first visit together. He is seen for the following medical problems.  1. CAD - anterior STEMI 08/2015, received DES to LAD.  - 08/2015 echo LVEF [id number], grade I diastolic dysfunction - denies any chest pain since last visit. No SOB or DOE - compliant with meds   2. HTN - compliant with meds     SH: works for [employer].  Past Medical History  Diagnosis Date  . CAD (coronary artery disease)     a. 09/19/15 ant STEMI s/p DES to ost LAD. Otherwise mild disease affecting the RCA and LCX.   Marland Kitchen HLD (hyperlipidemia)   . Left ventricular systolic dysfunction without heart failure     a. mild. 08/2015 2D ECHO with EF 45-50%, diffuse HK, G1DD     No Known Allergies   Current Outpatient Prescriptions  Medication Sig Dispense Refill  . aspirin 81 MG chewable tablet Chew 1 tablet (81 mg total) by mouth daily.    Marland Kitchen atorvastatin (LIPITOR) 80 MG tablet Take 1 tablet (80 mg total) by mouth daily at 6 PM. 90 tablet 0  . carvedilol (COREG) 3.125 MG tablet Take 1 tablet (3.125 mg total) by mouth 2 (two) times daily with a meal. 180 tablet 0  . prasugrel (EFFIENT) 10 MG TABS tablet Take 1 tablet (10 mg total) by mouth daily. 30 tablet 11   No current facility-administered medications for this visit.     Past Surgical History  Procedure Laterality Date  . Hernia repair    . No past surgeries    . Cardiac catheterization N/A 09/19/2015    Procedure: Left Heart Cath and Coronary Angiography;  Surgeon: Wellington Hampshire, MD;  Location: [facility];  Service: Cardiovascular;  Laterality: N/A;  . Cardiac catheterization N/A 09/19/2015    Procedure: Coronary Stent Intervention;  Surgeon: Wellington Hampshire, MD;  Location: [facility];  Service: Cardiovascular;  Laterality: N/A;     No Known  Allergies    Family history Denies any family history of heart disease   Social History Devin Gardner reports that he has never smoked. He has never used smokeless tobacco. Devin Gardner reports that he drinks alcohol.   Review of Systems CONSTITUTIONAL: No weight loss, fever, chills, weakness or fatigue.  HEENT: Eyes: No visual loss, blurred vision, double vision or yellow sclerae.No hearing loss, sneezing, congestion, runny nose or sore throat.  SKIN: No rash or itching.  CARDIOVASCULAR: RRR, no m/r/g, no jvd RESPIRATORY: No shortness of breath, cough or sputum.  GASTROINTESTINAL: No anorexia, nausea, vomiting or diarrhea. No abdominal pain or blood.  GENITOURINARY: No burning on urination, no polyuria NEUROLOGICAL: No headache, dizziness, syncope, paralysis, ataxia, numbness or tingling in the extremities. No change in bowel or bladder control.  MUSCULOSKELETAL: No muscle, back pain, joint pain or stiffness.  LYMPHATICS: No enlarged nodes. No history of splenectomy.  PSYCHIATRIC: No history of depression or anxiety.  ENDOCRINOLOGIC: No reports of sweating, cold or heat intolerance. No polyuria or polydipsia.  Marland Kitchen   Physical Examination Filed Vitals:   01/03/16 1041  BP: 138/100  Pulse: 65   Filed Vitals:   01/03/16 1041  Height: 5\' 11"  (1.803 m)  Weight: 187 lb (84.823 kg)    Gen: resting comfortably, no acute distress HEENT: no scleral  icterus, pupils equal round and reactive, no palptable cervical adenopathy,  CV: RRR, no m/r/g, no jvd Resp: Clear to auscultation bilaterally GI: abdomen is soft, non-tender, non-distended, normal bowel sounds, no hepatosplenomegaly MSK: extremities are warm, no edema.  Skin: warm, no rash Neuro:  no focal deficits Psych: appropriate affect   Diagnostic Studies 08/2015 echo  Study Conclusions  - Left ventricle: The cavity size was normal. There was mild focal basal hypertrophy of the septum. Systolic function was mildly reduced.  The estimated ejection fraction was in the range of 45% to 50%. Diffuse hypokinesis. There was an increased relative contribution of atrial contraction to ventricular filling. Doppler parameters are consistent with abnormal left ventricular relaxation (grade 1 diastolic dysfunction).   08/2015 cath  The left ventricular systolic function is normal.  Prox RCA lesion, 20% stenosed.  Mid LAD lesion, 40% stenosed.  Ost Cx lesion, 40% stenosed.  Ost LAD lesion, 99% stenosed. Post intervention, there is a 0% residual stenosis.  1. Severe one-vessel coronary artery disease with 99% thrombotic stenosis in the proximal/ostial LAD. Otherwise mild disease affecting the RCA and LCX.  2. Low normal LV systolic function. Mildly elevated left ventricular end-diastolic pressure. 3. Successful angioplasty and drug-eluting stent placement to the LAD.  Post cath Recommendations: Dual antiplatelet therapy for at least one year. Cardiac rehabilitation and usual post MI care.    Assessment and Plan  1. CAD - no current symptoms, continue current meds. Continue DAPT at least until 08/2016  2. HTN - above goal in clinic, start lisionpril 2.5mg  daily for bp and additional cardiovascular benefits s/p CAD/MI. Check BMET in 2 weeks.       Arnoldo Lenis, M.D.

## 2016-01-04 ENCOUNTER — Telehealth: Payer: Self-pay | Admitting: *Deleted

## 2016-01-04 DIAGNOSIS — E785 Hyperlipidemia, unspecified: Secondary | ICD-10-CM

## 2016-01-04 LAB — HEPATIC FUNCTION PANEL
ALBUMIN: 4.3 g/dL (ref 3.6–5.1)
ALT: 45 U/L (ref 9–46)
AST: 45 U/L — AB (ref 10–35)
Alkaline Phosphatase: 48 U/L (ref 40–115)
Bilirubin, Direct: 0.2 mg/dL (ref <=0.2)
Indirect Bilirubin: 0.6 mg/dL (ref 0.2–1.2)
Total Bilirubin: 0.8 mg/dL (ref 0.2–1.2)
Total Protein: 7.4 g/dL (ref 6.1–8.1)

## 2016-01-04 LAB — LIPID PANEL
CHOL/HDL RATIO: 1.9 ratio (ref <=5.0)
CHOLESTEROL: 139 mg/dL (ref 125–200)
HDL: 75 mg/dL (ref >=40)
LDL Cholesterol: 45 mg/dL (ref <130)
Triglycerides: 93 mg/dL (ref <150)
VLDL: 19 mg/dL (ref <30)

## 2016-01-04 MED ORDER — ATORVASTATIN CALCIUM 40 MG PO TABS: 40.0000 mg | ORAL_TABLET | Freq: Every day | ORAL | Status: DC

## 2016-01-04 NOTE — Telephone Encounter (Signed)
-----   Message from Lendon Colonel, NP sent at 01/04/2016  7:05 AM EST ----- Excellent results of cholesterol studies!! Mild bump in LFT AST.  Can reduce the dose to 40 mg daily at HS. Follow up studies in 6 months. Keep up the good work!!

## 2016-01-04 NOTE — Telephone Encounter (Signed)
Orders placed.

## 2016-01-06 ENCOUNTER — Other Ambulatory Visit: Payer: Self-pay | Admitting: *Deleted

## 2016-01-06 MED ORDER — ATORVASTATIN CALCIUM 40 MG PO TABS: 40.0000 mg | ORAL_TABLET | Freq: Every day | ORAL | Status: DC

## 2016-01-17 ENCOUNTER — Other Ambulatory Visit (HOSPITAL_COMMUNITY)
Admission: RE | Admit: 2016-01-17 | Discharge: 2016-01-17 | Disposition: A | Discharge status: Home / Self Care | Payer: BLUE CROSS/BLUE SHIELD | Source: Ambulatory Visit | Attending: Cardiology | Admitting: Cardiology

## 2016-01-17 DIAGNOSIS — Z79899 Other long term (current) drug therapy: Secondary | ICD-10-CM | POA: Insufficient documentation

## 2016-01-17 LAB — BASIC METABOLIC PANEL
Anion gap: 9 (ref 5–15)
BUN: 13 mg/dL (ref 6–20)
CO2: 24 mmol/L (ref 22–32)
CREATININE: 1.02 mg/dL (ref 0.61–1.24)
Calcium: 9.4 mg/dL (ref 8.9–10.3)
Chloride: 105 mmol/L (ref 101–111)
GFR calc Af Amer: >60 mL/min (ref >60)
GFR calc non Af Amer: >60 mL/min (ref >60)
GLUCOSE: 98 mg/dL (ref 65–99)
Potassium: 4.4 mmol/L (ref 3.5–5.1)
Sodium: 138 mmol/L (ref 135–145)

## 2016-01-26 ENCOUNTER — Telehealth: Payer: Self-pay | Admitting: Adult Health

## 2016-01-26 NOTE — Telephone Encounter (Signed)
I've never seen the patient and gout really hasn't officaly been diagnosed so I'm not comfortable treating him for this. If he is in pain and doesn't have a primary care I'd advise him to go to the nearest urgent care center and encourage him to obtain a PCP

## 2016-01-26 NOTE — Telephone Encounter (Signed)
Spoke to pt and let him know that we could not write him a prescription for gout medication at this time. I told him that it is important that he get a PCP for those type of problems or he could go to an urgent care.

## 2016-01-26 NOTE — Telephone Encounter (Signed)
Sent to [facility] 

## 2016-01-26 NOTE — Telephone Encounter (Signed)
Patient states that Devin Gardner stated at the last visit she would give him medication for "gout". He states that he is needing that now. / tg

## 2016-01-26 NOTE — Telephone Encounter (Signed)
Patient prefers that we call this into [facility] in [place] so that he doesn't have to wait a full week.  Patient said he's in a lot of pain and was wondering if we could call something in for him today. He verified that he does not have a PCP.  Routing to [place] since [place] won't be back until Friday, 01/27/16

## 2016-02-01 ENCOUNTER — Other Ambulatory Visit: Payer: Self-pay | Admitting: Cardiology

## 2016-02-05 ENCOUNTER — Encounter (HOSPITAL_COMMUNITY): Payer: Self-pay | Admitting: Emergency Medicine

## 2016-02-05 ENCOUNTER — Emergency Department (HOSPITAL_COMMUNITY)
Admission: EM | Admit: 2016-02-05 | Discharge: 2016-02-05 | Disposition: A | Discharge status: Home / Self Care | Payer: BLUE CROSS/BLUE SHIELD | Attending: Emergency Medicine | Admitting: Emergency Medicine

## 2016-02-05 DIAGNOSIS — I2510 Atherosclerotic heart disease of native coronary artery without angina pectoris: Secondary | ICD-10-CM | POA: Diagnosis not present

## 2016-02-05 DIAGNOSIS — Z79899 Other long term (current) drug therapy: Secondary | ICD-10-CM | POA: Diagnosis not present

## 2016-02-05 DIAGNOSIS — K1379 Other lesions of oral mucosa: Secondary | ICD-10-CM | POA: Diagnosis not present

## 2016-02-05 DIAGNOSIS — Z7982 Long term (current) use of aspirin: Secondary | ICD-10-CM | POA: Insufficient documentation

## 2016-02-05 DIAGNOSIS — D688 Other specified coagulation defects: Secondary | ICD-10-CM | POA: Diagnosis present

## 2016-02-05 DIAGNOSIS — E785 Hyperlipidemia, unspecified: Secondary | ICD-10-CM | POA: Insufficient documentation

## 2016-02-05 LAB — CBC WITH DIFFERENTIAL/PLATELET
BASOS ABS: 0.1 10*3/uL (ref 0.0–0.1)
Basophils Relative: 0 %
Eosinophils Absolute: 0.2 10*3/uL (ref 0.0–0.7)
Eosinophils Relative: 2 %
HEMATOCRIT: 33.2 % — AB (ref 39.0–52.0)
Hemoglobin: 11.4 g/dL — ABNORMAL LOW (ref 13.0–17.0)
LYMPHS PCT: 11 %
Lymphs Abs: 1.3 10*3/uL (ref 0.7–4.0)
MCH: 32.7 pg (ref 26.0–34.0)
MCHC: 34.3 g/dL (ref 30.0–36.0)
MCV: 95.1 fL (ref 78.0–100.0)
MONO ABS: 0.9 10*3/uL (ref 0.1–1.0)
MONOS PCT: 7 %
NEUTROS ABS: 10.1 10*3/uL — AB (ref 1.7–7.7)
Neutrophils Relative %: 80 %
Platelets: 351 10*3/uL (ref 150–400)
RBC: 3.49 MIL/uL — ABNORMAL LOW (ref 4.22–5.81)
RDW: 13.3 % (ref 11.5–15.5)
WBC: 12.5 10*3/uL — ABNORMAL HIGH (ref 4.0–10.5)

## 2016-02-05 MED ORDER — TRANEXAMIC ACID 1000 MG/10ML IV SOLN
500.0000 mg | Freq: Once | INTRAVENOUS | Status: AC
  Administered 2016-02-05: 500 mg via TOPICAL
  Filled 2016-02-05: qty 10

## 2016-02-05 NOTE — ED Provider Notes (Signed)
CSN: [id number]     Arrival date & time 02/05/16  [id number] History   First MD Initiated Contact with Patient 02/05/16 [phone]     Chief Complaint  Patient presents with  . Coagulation Disorder     (Consider location/radiation/quality/duration/timing/severity/associated sxs/prior Treatment) HPI A right upper third molar that came loose yesterday evening. States the tooth came out "root and all". He's been having persistent bleeding from the site since. Patient is on aspirin and Effient daily since MI and stent placement in October 2016. No lightheadedness or near syncope. Past Medical History  Diagnosis Date  . CAD (coronary artery disease)     a. 09/19/15 ant STEMI s/p DES to ost LAD. Otherwise mild disease affecting the RCA and LCX.   Marland Kitchen HLD (hyperlipidemia)   . Left ventricular systolic dysfunction without heart failure     a. mild. 08/2015 2D ECHO with EF 45-50%, diffuse HK, G1DD   Past Surgical History  Procedure Laterality Date  . Hernia repair    . No past surgeries    . Cardiac catheterization N/A 09/19/2015    Procedure: Left Heart Cath and Coronary Angiography;  Surgeon: Wellington Hampshire, MD;  Location: [facility];  Service: Cardiovascular;  Laterality: N/A;  . Cardiac catheterization N/A 09/19/2015    Procedure: Coronary Stent Intervention;  Surgeon: Wellington Hampshire, MD;  Location: [facility];  Service: Cardiovascular;  Laterality: N/A;   History reviewed. No pertinent family history. Social History  Substance Use Topics  . Smoking status: Never Smoker   . Smokeless tobacco: Never Used  . Alcohol Use: 0.0 oz/week    0 Standard drinks or equivalent per week     Comment: 4-5 beers daily    Review of Systems  Constitutional: Negative for fever and chills.  HENT: Positive for dental problem.   Skin: Positive for wound.  Neurological: Negative for dizziness, weakness and light-headedness.  All other systems reviewed and are negative.     Allergies    Review of patient's allergies indicates no known allergies.  Home Medications   Prior to Admission medications   Medication Sig Start Date End Date Taking? Authorizing Provider  aspirin 81 MG chewable tablet Chew 1 tablet (81 mg total) by mouth daily. 09/21/15   Eileen Stanford, PA-C  atorvastatin (LIPITOR) 40 MG tablet Take 1 tablet (40 mg total) by mouth daily. 01/06/16   Lendon Colonel, NP  carvedilol (COREG) 3.125 MG tablet Take 1 tablet by mouth two  times daily with meals 02/01/16   Arnoldo Lenis, MD  lisinopril (PRINIVIL,ZESTRIL) 2.5 MG tablet Take 1 tablet (2.5 mg total) by mouth daily. 01/03/16   Arnoldo Lenis, MD  prasugrel (EFFIENT) 10 MG TABS tablet Take 1 tablet (10 mg total) by mouth daily. 09/21/15   Eileen Stanford, PA-C   BP 132/88 mmHg  Pulse 103  Temp(Src) 97.5 F (36.4 C) (Oral)  Resp 18  Ht 5\' 11"  (1.803 m)  Wt 185 lb (83.915 kg)  BMI 25.81 kg/m2  SpO2 100% Physical Exam  Constitutional: He is oriented to person, place, and time. He appears well-developed and well-nourished. No distress.  HENT:  Head: Normocephalic and atraumatic.  Mouth/Throat: Oropharynx is clear and moist.    Eyes: EOM are normal. Pupils are equal, round, and reactive to light.  Neck: Normal range of motion. Neck supple.  Cardiovascular: Normal rate and regular rhythm.   Pulmonary/Chest: Effort normal and breath sounds normal.  Abdominal: Soft. Bowel sounds are  normal.  Musculoskeletal: Normal range of motion. He exhibits no edema or tenderness.  Neurological: He is alert and oriented to person, place, and time.  No orthostasis. And related without difficulty. 5/5 motor in all extremities. Sensation is intact.  Skin: Skin is warm and dry. No rash noted. No erythema.  Psychiatric: He has a normal mood and affect. His behavior is normal.  Nursing note and vitals reviewed.   ED Course  Procedures (including critical care time) Labs Review Labs Reviewed  CBC WITH  DIFFERENTIAL/PLATELET - Abnormal; Notable for the following:    WBC 12.5 (*)    RBC 3.49 (*)    Hemoglobin 11.4 (*)    HCT 33.2 (*)    Neutro Abs 10.1 (*)    All other components within normal limits    Imaging Review No results found. I have personally reviewed and evaluated these images and lab results as part of my medical decision-making.   EKG Interpretation None      MDM   Final diagnoses:  Bleeding from mouth   TXA soaked gauze. Placed. Bleeding has abated. Patient has hemoglobin of 11 which is down from around 15 in October. No indication for transfusion at this point. Patient encouraged to drink only fluids and to take iron replacement. Also encouraged to follow-up with his dentist. He's been given return precautions and has voiced understanding.     Julianne Rice, MD 02/05/16 [phone]

## 2016-02-05 NOTE — Discharge Instructions (Signed)
Keep packing in place. Call and make an appointment to follow-up with your dentist. Return immediately for worsening bleeding.

## 2016-02-05 NOTE — ED Notes (Signed)
Per patient wisdom tooth on right upper side came out last night. Patient states "root and all." Per patient oral bleeding since. Patient takes Effient and aspirin.

## 2016-06-28 ENCOUNTER — Other Ambulatory Visit: Payer: Self-pay | Admitting: *Deleted

## 2016-06-28 ENCOUNTER — Telehealth: Payer: Self-pay | Admitting: Adult Health

## 2016-06-28 DIAGNOSIS — E785 Hyperlipidemia, unspecified: Secondary | ICD-10-CM

## 2016-06-28 NOTE — Telephone Encounter (Signed)
Appt made and orders placed.

## 2016-06-28 NOTE — Progress Notes (Signed)
Orders placed per result note on 01/04/16

## 2016-07-10 ENCOUNTER — Other Ambulatory Visit (HOSPITAL_COMMUNITY)
Admission: RE | Admit: 2016-07-10 | Discharge: 2016-07-10 | Disposition: A | Discharge status: Home / Self Care | Payer: BLUE CROSS/BLUE SHIELD | Source: Ambulatory Visit | Attending: Adult Health | Admitting: Adult Health

## 2016-07-10 DIAGNOSIS — E785 Hyperlipidemia, unspecified: Secondary | ICD-10-CM | POA: Diagnosis present

## 2016-07-10 LAB — LIPID PANEL
CHOL/HDL RATIO: 2 ratio
Cholesterol: 154 mg/dL (ref 0–200)
HDL: 76 mg/dL (ref >40)
LDL Cholesterol: 40 mg/dL (ref 0–99)
TRIGLYCERIDES: 188 mg/dL — AB (ref <150)
VLDL: 38 mg/dL (ref 0–40)

## 2016-07-10 LAB — HEPATIC FUNCTION PANEL
ALK PHOS: 41 U/L (ref 38–126)
ALT: 28 U/L (ref 17–63)
AST: 33 U/L (ref 15–41)
Albumin: 4.1 g/dL (ref 3.5–5.0)
BILIRUBIN DIRECT: 0.1 mg/dL (ref 0.1–0.5)
BILIRUBIN INDIRECT: 1.1 mg/dL — AB (ref 0.3–0.9)
BILIRUBIN TOTAL: 1.2 mg/dL (ref 0.3–1.2)
Total Protein: 7.7 g/dL (ref 6.5–8.1)

## 2016-07-11 ENCOUNTER — Encounter: Payer: Self-pay | Admitting: Cardiology

## 2016-08-11 ENCOUNTER — Ambulatory Visit: Payer: BLUE CROSS/BLUE SHIELD | Admitting: Cardiology

## 2016-08-17 NOTE — Progress Notes (Signed)
Cardiology Office Note   Date:  08/18/2016   ID:  Devin Gardner, DOB 1956/02/18, MRN [id number]  PCP:  No PCP Per Patient  Cardiologist:  Dr. Harl Bowie    Chief Complaint  Patient presents with  . Coronary Artery Disease    hx of MI      History of Present Illness: Devin Gardner is a 60 y.o. male who presents for CAD with anterior STEMI 08/2015, received DES to LAD.  - 08/2015 echo LVEF [id number], grade I diastolic dysfunction  and HTN.  Since last visit in 12/2015 he has no complaints, no chest pain or SOB.  He is walking for exercise and does watch his diet.  His lipids just done were good with LDL 40.  Continue statin. We discussed importance of meds and possibility of stopping Effient in Nov. If no problems.    Past Medical History:  Diagnosis Date  . CAD (coronary artery disease)    a. 09/19/15 ant STEMI s/p DES to ost LAD. Otherwise mild disease affecting the RCA and LCX.   Marland Kitchen HLD (hyperlipidemia)   . Left ventricular systolic dysfunction without heart failure    a. mild. 08/2015 2D ECHO with EF 45-50%, diffuse HK, G1DD    Past Surgical History:  Procedure Laterality Date  . CARDIAC CATHETERIZATION N/A 09/19/2015   Procedure: Left Heart Cath and Coronary Angiography;  Surgeon: Wellington Hampshire, MD;  Location: [facility];  Service: Cardiovascular;  Laterality: N/A;  . CARDIAC CATHETERIZATION N/A 09/19/2015   Procedure: Coronary Stent Intervention;  Surgeon: Wellington Hampshire, MD;  Location: [facility];  Service: Cardiovascular;  Laterality: N/A;  . HERNIA REPAIR    . NO PAST SURGERIES       Current Outpatient Prescriptions  Medication Sig Dispense Refill  . aspirin 81 MG chewable tablet Chew 1 tablet (81 mg total) by mouth daily.    Marland Kitchen atorvastatin (LIPITOR) 40 MG tablet Take 1 tablet (40 mg total) by mouth daily. 90 tablet 3  . carvedilol (COREG) 3.125 MG tablet Take 1 tablet by mouth two  times daily with meals 180 tablet 1  . lisinopril  (PRINIVIL,ZESTRIL) 2.5 MG tablet Take 1 tablet (2.5 mg total) by mouth daily. 90 tablet 3  . prasugrel (EFFIENT) 10 MG TABS tablet Take 1 tablet (10 mg total) by mouth daily. 30 tablet 11   No current facility-administered medications for this visit.     Allergies:   Review of patient's allergies indicates no known allergies.    Social History:  The patient  reports that he has never smoked. He has never used smokeless tobacco. He reports that he drinks alcohol. He reports that he does not use drugs.   Family History:  The patient's Father had MI in his 64s   ROS:  General:no colds or fevers, no weight changes Skin:no rashes or ulcers HEENT:no blurred vision, no congestion CV:see HPI PUL:see HPI GI:no diarrhea constipation or melena, no indigestion GU:no hematuria, no dysuria MS:no joint pain, no claudication Neuro:no syncope, no lightheadedness Endo:no diabetes, no thyroid disease  Wt Readings from Last 3 Encounters:  08/18/16 189 lb (85.7 kg)  02/05/16 185 lb (83.9 kg)  01/03/16 187 lb (84.8 kg)     PHYSICAL EXAM: VS:  BP (!) 141/88   Pulse 71   Ht 5\' 11"  (1.803 m)   Wt 189 lb (85.7 kg)   SpO2 97%   BMI 26.36 kg/m  , BMI Body mass index is 26.36  kg/m. General:Pleasant affect, NAD Skin:Warm and dry, brisk capillary refill HEENT:normocephalic, sclera clear, mucus membranes moist Neck:supple, no JVD, no bruits  Heart:S1S2 RRR without murmur, gallup, rub or click Lungs:clear without rales, rhonchi, or wheezes [id number], non tender, + BS, do not palpate liver spleen or masses Ext:no lower ext edema, 2+ pedal pulses, 2+ radial pulses Neuro:alert and oriented, MAE, follows commands, + facial symmetry    EKG:  EKG is NOT ordered today.  Recent Labs: 01/17/2016: BUN 13; Creatinine, Ser 1.02; Potassium 4.4; Sodium 138 02/05/2016: Hemoglobin 11.4; Platelets 351 07/10/2016: ALT 28    Lipid Panel    Component Value Date/Time   CHOL 154 07/10/2016 1048   TRIG 188 (H)  07/10/2016 1048   HDL 76 07/10/2016 1048   CHOLHDL 2.0 07/10/2016 1048   VLDL 38 07/10/2016 1048   LDLCALC 40 07/10/2016 1048       Other studies Reviewed: Additional studies/ records that were reviewed today include:. Echo 08/2015:  Study Conclusions  - Left ventricle: The cavity size was normal. There was mild focal   basal hypertrophy of the septum. Systolic function was mildly   reduced. The estimated ejection fraction was in the range of 45%   to 50%. Diffuse hypokinesis. There was an increased relative   contribution of atrial contraction to ventricular filling.   Doppler parameters are consistent with abnormal left ventricular   relaxation (grade 1 diastolic dysfunction).  Cath 08/2015: Procedures   Coronary Stent Intervention  Left Heart Cath and Coronary Angiography  Conclusion    The left ventricular systolic function is normal.  Prox RCA lesion, 20% stenosed.  Mid LAD lesion, 40% stenosed.  Ost Cx lesion, 40% stenosed.  Ost LAD lesion, 99% stenosed. Post intervention, there is a 0% residual stenosis.   1. Severe one-vessel coronary artery disease with 99% thrombotic stenosis in the proximal/ostial LAD. Otherwise mild disease affecting the RCA and LCX.  2. Low normal LV systolic function. Mildly elevated left ventricular end-diastolic pressure. 3. Successful angioplasty and drug-eluting stent placement to the LAD.  Recommendations: Dual antiplatelet therapy for at least one year. Cardiac rehabilitation and usual post MI care.   Indications   ST elevation (STEMI) myocardial infarction involving left anterior descending coronary artery ([place]     ASSESSMENT AND PLAN 1.  CAD- no chest pain, no SOB.. Continues to eat healthy and exercise by walking.  Stent to LAD 09/18/16 if no further issues may be able to stop effient after 09/18/16 will check with Dr. Harl Bowie.   2. HTN--elevated today, discussed increasing lisinopril but just back from vacation and  would like to recheck first.  Will have him come back in 2 weeks to re check BP   If BP > 130/85 in 2 weeks- would increase lisinopril to 5 mg daily and recheck BMP in 2-3 weeks.    3. Hyperlipidemia-well controlled continue statin  4. Mild LV dysfunction with EF 45-50% on ACE euvolemic and no signs or SX of HF.  Follow up with Dr. Harl Bowie in 6 months unless problems prior to that time.    Current medicines are reviewed with the patient today.  The patient Has no concerns regarding medicines.  The following changes have been made:  See above Labs/ tests ordered today include:see above  Disposition:   FU:  see above  Signed, Cecilie Kicks, NP  08/18/2016 1:28 PM    [facility] [place], [place] [place], [place]  Phone: [phone]; Fax: [phone]

## 2016-08-18 ENCOUNTER — Encounter: Payer: Self-pay | Admitting: Cardiology

## 2016-08-18 ENCOUNTER — Ambulatory Visit (INDEPENDENT_AMBULATORY_CARE_PROVIDER_SITE_OTHER): Payer: BLUE CROSS/BLUE SHIELD | Admitting: Cardiology

## 2016-08-18 VITALS — BP 141/88 | HR 71 | Ht 71.0 in | Wt 189.0 lb

## 2016-08-18 DIAGNOSIS — I10 Essential (primary) hypertension: Secondary | ICD-10-CM | POA: Diagnosis not present

## 2016-08-18 DIAGNOSIS — I2510 Atherosclerotic heart disease of native coronary artery without angina pectoris: Secondary | ICD-10-CM

## 2016-08-18 DIAGNOSIS — I519 Heart disease, unspecified: Secondary | ICD-10-CM | POA: Diagnosis not present

## 2016-08-18 DIAGNOSIS — E785 Hyperlipidemia, unspecified: Secondary | ICD-10-CM | POA: Diagnosis not present

## 2016-08-18 NOTE — Patient Instructions (Signed)
Your physician wants you to follow-up in: 6 Months with Dr. Harl Bowie. You will receive a reminder letter in the mail two months in advance. If you don't receive a letter, please call our office to schedule the follow-up appointment.  Your physician recommends that you schedule a follow-up appointment in: 2 Weeks for Blood pressure check.   Your physician recommends that you continue on your current medications as directed. Please refer to the Current Medication list given to you today.  If you need a refill on your cardiac medications before your next appointment, please call your pharmacy.  Thank you for choosing [facility]!

## 2016-09-03 ENCOUNTER — Other Ambulatory Visit: Payer: Self-pay | Admitting: Cardiology

## 2016-09-04 ENCOUNTER — Ambulatory Visit (INDEPENDENT_AMBULATORY_CARE_PROVIDER_SITE_OTHER): Payer: BLUE CROSS/BLUE SHIELD

## 2016-09-04 VITALS — BP 140/80 | HR 62

## 2016-09-04 DIAGNOSIS — I10 Essential (primary) hypertension: Secondary | ICD-10-CM

## 2016-09-04 MED ORDER — LISINOPRIL 5 MG PO TABS: 5.0000 mg | ORAL_TABLET | Freq: Every day | ORAL | 3 refills | Status: DC

## 2016-09-04 NOTE — Progress Notes (Signed)
BP elevated today ,so per L Ingold NP,Lisinopril increased to 5 mg daily with BMET in 2-3 weeks    Patient also believes he may stop Effient now in Oct after the 23 rd, it has been 1 year.I will forward to Dr Harl Bowie

## 2016-09-04 NOTE — Patient Instructions (Addendum)
INCREASE     Lisinopril to 5 mg daily    Get blood work in 2-3 weeks      Keep apt with Dr Harl Bowie

## 2016-09-05 ENCOUNTER — Telehealth: Payer: Self-pay

## 2016-09-05 NOTE — Telephone Encounter (Signed)
Pt to stop Effient

## 2016-09-05 NOTE — Telephone Encounter (Signed)
-----   Message from Arnoldo Lenis, MD sent at 09/05/2016  8:59 AM EDT ----- Ok to stop effient 09/18/16  JBranch MD ----- Message ----- From: Bernita Raisin, RN Sent: 09/04/2016   4:29 PM To: Arnoldo Lenis, MD

## 2016-09-25 ENCOUNTER — Other Ambulatory Visit (HOSPITAL_COMMUNITY)
Admission: RE | Admit: 2016-09-25 | Discharge: 2016-09-25 | Disposition: A | Discharge status: Home / Self Care | Payer: BLUE CROSS/BLUE SHIELD | Source: Ambulatory Visit | Attending: Cardiology | Admitting: Cardiology

## 2016-09-25 DIAGNOSIS — I10 Essential (primary) hypertension: Secondary | ICD-10-CM | POA: Insufficient documentation

## 2016-09-25 LAB — BASIC METABOLIC PANEL
ANION GAP: 10 (ref 5–15)
BUN: 13 mg/dL (ref 6–20)
CALCIUM: 9.3 mg/dL (ref 8.9–10.3)
CO2: 24 mmol/L (ref 22–32)
CREATININE: 0.99 mg/dL (ref 0.61–1.24)
Chloride: 101 mmol/L (ref 101–111)
Glucose, Bld: 94 mg/dL (ref 65–99)
Potassium: 3.8 mmol/L (ref 3.5–5.1)
SODIUM: 135 mmol/L (ref 135–145)

## 2016-11-27 ENCOUNTER — Other Ambulatory Visit: Payer: Self-pay | Admitting: Adult Health

## 2017-02-17 ENCOUNTER — Other Ambulatory Visit: Payer: Self-pay | Admitting: Cardiology

## 2017-03-14 ENCOUNTER — Other Ambulatory Visit: Payer: Self-pay | Admitting: Cardiology

## 2017-06-03 ENCOUNTER — Other Ambulatory Visit: Payer: Self-pay | Admitting: Cardiology

## 2017-06-18 ENCOUNTER — Encounter: Payer: Self-pay | Admitting: Adult Health

## 2017-06-18 ENCOUNTER — Ambulatory Visit (INDEPENDENT_AMBULATORY_CARE_PROVIDER_SITE_OTHER): Payer: Managed Care, Other (non HMO) | Admitting: Adult Health

## 2017-06-18 VITALS — BP 142/80 | HR 82 | Ht 71.0 in | Wt 201.8 lb

## 2017-06-18 DIAGNOSIS — I2510 Atherosclerotic heart disease of native coronary artery without angina pectoris: Secondary | ICD-10-CM

## 2017-06-18 DIAGNOSIS — I10 Essential (primary) hypertension: Secondary | ICD-10-CM

## 2017-06-18 DIAGNOSIS — E7800 Pure hypercholesterolemia, unspecified: Secondary | ICD-10-CM | POA: Diagnosis not present

## 2017-06-18 NOTE — Patient Instructions (Signed)
Medication Instructions:  Your physician recommends that you continue on your current medications as directed. Please refer to the Current Medication list given to you today.   Labwork: ASAP- BMET FASTING LIPIDS CBC LFT'S    Testing/Procedures: NONE  Follow-Up: Your physician wants you to follow-up in: 1 YEAR.  You will receive a reminder letter in the mail two months in advance. If you don't receive a letter, please call our office to schedule the follow-up appointment.   Any Other Special Instructions Will Be Listed Below (If Applicable).  PLEASE CALL [facility] - [phone] TO MAKE AN APPOINTMENT WITH DR. Buelah Manis    If you need a refill on your cardiac medications before your next appointment, please call your pharmacy.

## 2017-06-18 NOTE — Progress Notes (Signed)
Cardiology Office Note   Date:  06/18/2017   ID:  Devin Gardner, Devin Gardner [birth year], MRN [id number]  PCP:  Patient, No Pcp Per  Cardiologist:  Branch Chief Complaint  Patient presents with  . Coronary Artery Disease  . Hyperlipidemia      History of Present Illness: DMARIO Gardner is a 61 y.o. male who presents for ongoing assessment and management of coronary artery disease, history of anterior ST elevated MI in 10 2016 with drug-eluting stent to the LAD. The patient was last seen in the office September 2017 and did not have any cardiac complaints. She is here for annual follow-up.  He comes today without cardiac complaints. He is active, having no chest pain, dyspnea or fatigue. He does not have a PCP currently. He has not had any hospitalizations, ER visits, surgeries, or new diagnosis. No new allergies. No new medications.   Past Medical History:  Diagnosis Date  . CAD (coronary artery disease)    a. 09/19/15 ant STEMI s/p DES to ost LAD. Otherwise mild disease affecting the RCA and LCX.   Marland Kitchen HLD (hyperlipidemia)   . Left ventricular systolic dysfunction without heart failure    a. mild. 08/2015 2D ECHO with EF 45-50%, diffuse HK, G1DD    Past Surgical History:  Procedure Laterality Date  . CARDIAC CATHETERIZATION N/A 09/19/2015   Procedure: Left Heart Cath and Coronary Angiography;  Surgeon: Wellington Hampshire, MD;  Location: [facility];  Service: Cardiovascular;  Laterality: N/A;  . CARDIAC CATHETERIZATION N/A 09/19/2015   Procedure: Coronary Stent Intervention;  Surgeon: Wellington Hampshire, MD;  Location: [facility];  Service: Cardiovascular;  Laterality: N/A;  . HERNIA REPAIR    . NO PAST SURGERIES       Current Outpatient Prescriptions  Medication Sig Dispense Refill  . aspirin 81 MG chewable tablet Chew 1 tablet (81 mg total) by mouth daily.    Marland Kitchen atorvastatin (LIPITOR) 40 MG tablet TAKE 1 TABLET BY MOUTH  DAILY 90 tablet 3  . carvedilol (COREG) 3.125 MG  tablet TAKE 1 TABLET BY MOUTH TWO  TIMES DAILY WITH MEALS 60 tablet 0  . lisinopril (PRINIVIL,ZESTRIL) 5 MG tablet Take 5 mg by mouth daily.     No current facility-administered medications for this visit.     Allergies:   Patient has no known allergies.    Social History:  The patient  reports that he has never smoked. He has never used smokeless tobacco. He reports that he drinks alcohol. He reports that he does not use drugs.   Family History:  The patient's family history includes Heart attack in his father; Stroke in his father.    ROS: All other systems are reviewed and negative. Unless otherwise mentioned in H&P    PHYSICAL EXAM: VS:  BP (!) 142/80   Pulse 82   Ht 5\' 11"  (1.803 m)   Wt 201 lb 12.8 oz (91.5 kg)   SpO2 97%   BMI 28.15 kg/m  , BMI Body mass index is 28.15 kg/m. GEN: Well nourished, well developed, in no acute distress  HEENT: normal  Neck: no JVD, carotid bruits, or masses Cardiac: RRR; no murmurs, rubs, or gallops,no edema  Respiratory:  clear to auscultation bilaterally, normal work of breathing GI: soft, nontender, nondistended, + BS MS: no deformity or atrophy  Skin: warm and dry, no rash Neuro:  Strength and sensation are intact Psych: euthymic mood, full affect   EKG: NSR rate of 82  bpm.     Recent Labs: 07/10/2016: ALT 28 09/25/2016: BUN 13; Creatinine, Ser 0.99; Potassium 3.8; Sodium 135    Lipid Panel    Component Value Date/Time   CHOL 154 07/10/2016 1048   TRIG 188 (H) 07/10/2016 1048   HDL 76 07/10/2016 1048   CHOLHDL 2.0 07/10/2016 1048   VLDL 38 07/10/2016 1048   LDLCALC 40 07/10/2016 1048      Wt Readings from Last 3 Encounters:  06/18/17 201 lb 12.8 oz (91.5 kg)  08/18/16 189 lb (85.7 kg)  02/05/16 185 lb (83.9 kg)      Other studies Reviewed: Cardiac Catheterization 09/19/2015 Conclusion    The left ventricular systolic function is normal.  Prox RCA lesion, 20% stenosed.  Mid LAD lesion, 40%  stenosed.  Ost Cx lesion, 40% stenosed.  Ost LAD lesion, 99% stenosed. Post intervention, there is a 0% residual stenosis.   1. Severe one-vessel coronary artery disease with 99% thrombotic stenosis in the proximal/ostial LAD. Otherwise mild disease affecting the RCA and LCX.  2. Low normal LV systolic function. Mildly elevated left ventricular end-diastolic pressure. 3. Successful angioplasty and drug-eluting stent placement to the LAD.   Echocardiogram 09/20/2015 Left ventricle: The cavity size was normal. There was mild focal   basal hypertrophy of the septum. Systolic function was mildly   reduced. The estimated ejection fraction was in the range of 45%   to 50%. Diffuse hypokinesis. There was an increased relative   contribution of atrial contraction to ventricular filling.   Doppler parameters are consistent with abnormal left ventricular   relaxation (grade 1 diastolic dysfunction).  ASSESSMENT AND PLAN:  1. CAD: No cardiac complaints at this time. No plans for cardiac testing unless he symptomatic. Will order fasting lipids and LFT's, BMET and CBC.   2. Hypercholesterolemia; Labs as above. He will continue low cholesterol diet.   3. Hypertension: Moderately controlled. Refills on all medications. He has 90 day supply.    I have referred him to [facility] for establishment of PCP, as this is close to where he lives and works.    Current medicines are reviewed at length with the patient today.    Labs/ tests ordered today include: Lipids and LFT's, BMET and CBC.   Phill Myron. West Pugh, ANP, AACC   06/18/2017 3:59 PM    [facility] [address], [place],  [address] Phone: [phone]; Fax: [phone]

## 2017-07-06 IMAGING — CR DG CHEST 1V PORT
1 series · 1 of 1 positions shown · non-contrast
Comparison: None.

CLINICAL DATA: Code STEMI.

EXAM:
PORTABLE CHEST 1 VIEW

[ap portable]
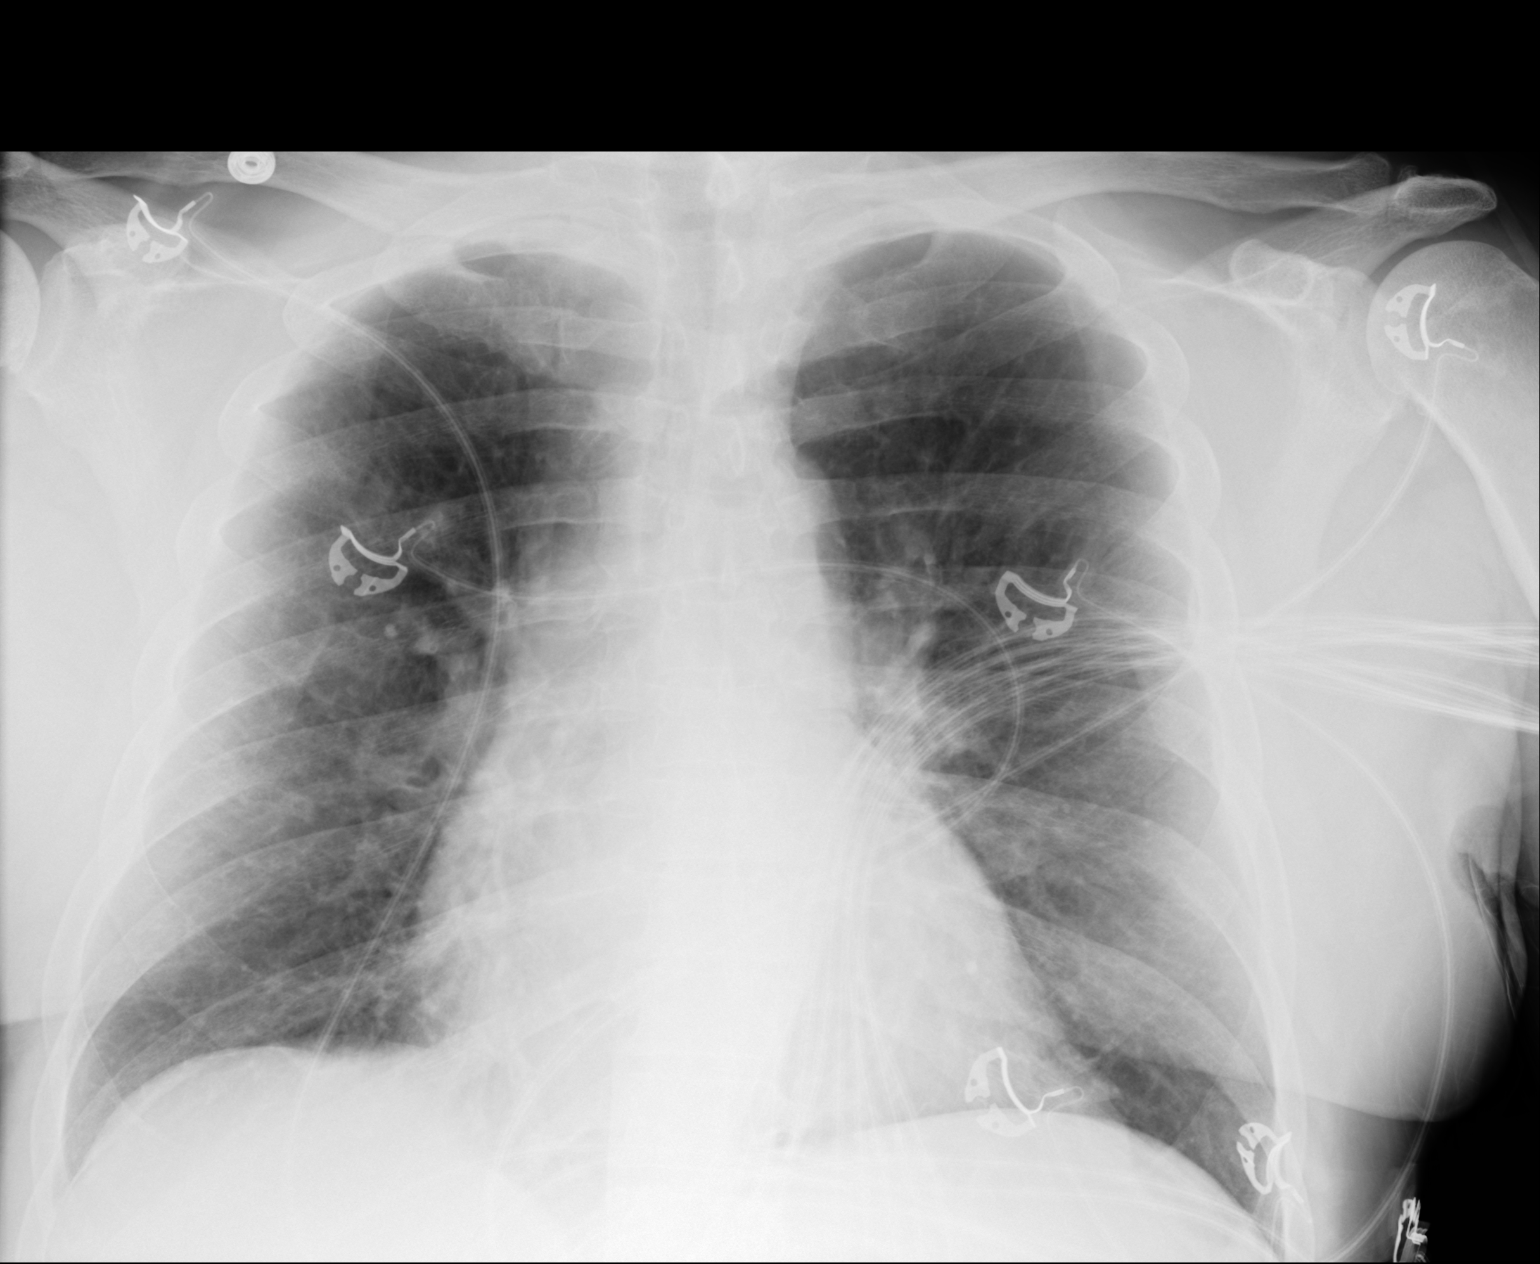

[1 of 1 positions shown; findings below may reference images not displayed]

FINDINGS: Lungs are adequately inflated without focal consolidation or
effusion. There is subtle prominence of the perihilar markings which
may be due to mild vascular congestion. Cardiomediastinal silhouette
is within normal remainder of the exam is within normal.
IMPRESSION: Possible minimal vascular congestion.

## 2017-07-09 ENCOUNTER — Other Ambulatory Visit (HOSPITAL_COMMUNITY)
Admission: RE | Admit: 2017-07-09 | Discharge: 2017-07-09 | Disposition: A | Discharge status: Home / Self Care | Payer: Managed Care, Other (non HMO) | Source: Ambulatory Visit | Attending: Adult Health | Admitting: Adult Health

## 2017-07-09 DIAGNOSIS — I10 Essential (primary) hypertension: Secondary | ICD-10-CM | POA: Diagnosis present

## 2017-07-09 LAB — BASIC METABOLIC PANEL
ANION GAP: 10 (ref 5–15)
BUN: 13 mg/dL (ref 6–20)
CALCIUM: 9.6 mg/dL (ref 8.9–10.3)
CO2: 26 mmol/L (ref 22–32)
Chloride: 101 mmol/L (ref 101–111)
Creatinine, Ser: 0.91 mg/dL (ref 0.61–1.24)
GFR calc Af Amer: >60 mL/min (ref >60)
GLUCOSE: 112 mg/dL — AB (ref 65–99)
POTASSIUM: 4.9 mmol/L (ref 3.5–5.1)
SODIUM: 137 mmol/L (ref 135–145)

## 2017-07-09 LAB — HEPATIC FUNCTION PANEL
ALBUMIN: 4.1 g/dL (ref 3.5–5.0)
ALT: 29 U/L (ref 17–63)
AST: 33 U/L (ref 15–41)
Alkaline Phosphatase: 34 U/L — ABNORMAL LOW (ref 38–126)
Bilirubin, Direct: 0.1 mg/dL (ref 0.1–0.5)
Indirect Bilirubin: 0.9 mg/dL (ref 0.3–0.9)
TOTAL PROTEIN: 7.5 g/dL (ref 6.5–8.1)
Total Bilirubin: 1 mg/dL (ref 0.3–1.2)

## 2017-07-09 LAB — CBC WITH DIFFERENTIAL/PLATELET
BASOS ABS: 0 10*3/uL (ref 0.0–0.1)
Basophils Relative: 1 %
EOS ABS: 0.3 10*3/uL (ref 0.0–0.7)
EOS PCT: 4 %
HCT: 43.3 % (ref 39.0–52.0)
Hemoglobin: 14.9 g/dL (ref 13.0–17.0)
Lymphocytes Relative: 28 %
Lymphs Abs: 1.8 10*3/uL (ref 0.7–4.0)
MCH: 34.1 pg — ABNORMAL HIGH (ref 26.0–34.0)
MCHC: 34.4 g/dL (ref 30.0–36.0)
MCV: 99.1 fL (ref 78.0–100.0)
MONO ABS: 0.5 10*3/uL (ref 0.1–1.0)
Monocytes Relative: 9 %
Neutro Abs: 3.8 10*3/uL (ref 1.7–7.7)
Neutrophils Relative %: 58 %
PLATELETS: 188 10*3/uL (ref 150–400)
RBC: 4.37 MIL/uL (ref 4.22–5.81)
RDW: 12.9 % (ref 11.5–15.5)
WBC: 6.4 10*3/uL (ref 4.0–10.5)

## 2017-07-09 LAB — LIPID PANEL
CHOL/HDL RATIO: 2.2 ratio
Cholesterol: 154 mg/dL (ref 0–200)
HDL: 70 mg/dL (ref >40)
LDL CALC: 45 mg/dL (ref 0–99)
TRIGLYCERIDES: 196 mg/dL — AB (ref <150)
VLDL: 39 mg/dL (ref 0–40)

## 2017-07-16 ENCOUNTER — Other Ambulatory Visit: Payer: Self-pay | Admitting: Adult Health

## 2017-07-16 MED ORDER — CARVEDILOL 3.125 MG PO TABS: ORAL_TABLET | ORAL | 1 refills | Status: DC

## 2017-07-16 NOTE — Telephone Encounter (Signed)
Pt is changing his pharmacy to [facility] Delivery telephone # [phone] fax # [phone] --will need a Rx of his carvedilol (COREG) 3.125 MG tablet [[id number]]  Sent to [facility] b/c he's out and will not receive it in time from his mail order.

## 2017-07-16 NOTE — Telephone Encounter (Signed)
Coreg sent to [facility] per request

## 2017-08-20 ENCOUNTER — Other Ambulatory Visit: Payer: Self-pay

## 2017-08-20 MED ORDER — LISINOPRIL 5 MG PO TABS: 5.0000 mg | ORAL_TABLET | Freq: Every day | ORAL | 3 refills | Status: DC

## 2017-08-20 NOTE — Telephone Encounter (Signed)
refilled lisinopril per fax request

## 2017-11-07 ENCOUNTER — Other Ambulatory Visit: Payer: Self-pay | Admitting: Adult Health

## 2018-01-02 ENCOUNTER — Other Ambulatory Visit: Payer: Self-pay | Admitting: Adult Health

## 2018-03-19 ENCOUNTER — Other Ambulatory Visit: Payer: Self-pay | Admitting: Adult Health

## 2018-04-19 ENCOUNTER — Other Ambulatory Visit: Payer: Self-pay | Admitting: Adult Health

## 2018-04-19 NOTE — Telephone Encounter (Signed)
Rx request sent to pharmacy.  

## 2018-07-05 ENCOUNTER — Ambulatory Visit: Payer: Managed Care, Other (non HMO) | Admitting: Family Medicine

## 2018-07-05 ENCOUNTER — Encounter: Payer: Self-pay | Admitting: Family Medicine

## 2018-07-05 VITALS — BP 138/78 | HR 74 | Temp 98.2°F | Resp 18 | Ht 69.0 in | Wt 205.0 lb

## 2018-07-05 DIAGNOSIS — I10 Essential (primary) hypertension: Secondary | ICD-10-CM

## 2018-07-05 DIAGNOSIS — E785 Hyperlipidemia, unspecified: Secondary | ICD-10-CM

## 2018-07-05 DIAGNOSIS — K4090 Unilateral inguinal hernia, without obstruction or gangrene, not specified as recurrent: Secondary | ICD-10-CM

## 2018-07-05 DIAGNOSIS — Z6830 Body mass index (BMI) 30.0-30.9, adult: Secondary | ICD-10-CM

## 2018-07-05 DIAGNOSIS — Z7689 Persons encountering health services in other specified circumstances: Secondary | ICD-10-CM | POA: Diagnosis not present

## 2018-07-05 DIAGNOSIS — E669 Obesity, unspecified: Secondary | ICD-10-CM

## 2018-07-05 DIAGNOSIS — N529 Male erectile dysfunction, unspecified: Secondary | ICD-10-CM | POA: Diagnosis not present

## 2018-07-05 DIAGNOSIS — I2510 Atherosclerotic heart disease of native coronary artery without angina pectoris: Secondary | ICD-10-CM | POA: Diagnosis not present

## 2018-07-05 DIAGNOSIS — Z0000 Encounter for general adult medical examination without abnormal findings: Secondary | ICD-10-CM

## 2018-07-05 NOTE — Progress Notes (Signed)
Patient ID: Devin Gardner, male    DOB: Nov 29, 1955, 62 y.o.   MRN: [id number]  PCP: Delsa Grana, PA-C  Chief Complaint  Patient presents with  . New Patient (Initial Visit)    not fasting     Subjective:   Devin Gardner is a 62 y.o. male, presents to clinic with CC of concerns of hernia and is new to establish care here.  He states he has been "living with this hernia for a long time' located to right groin, bulge has been unchanged in size, location or pain for 3-4 years.  It become "irritated" when he walks long distances, but he denies any firmness, redness, scrotal swelling, change in bowels, abd pain, urinary sx.  He can feel it slide in and out when he coughs.  He does not recall anything about when her first noticed it or it first occurred.   20 years ago he had left inguinal hernia repair, they said at that time that he would eventually need to get the other side fixed.  He would like to see Dr. Zella Richer - from recommendations from friends.  He also complains of erectile dysfunction for the past 2-3 years.  He is interested in taking medication for it, or testing to see if hernia is causing ED.    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    He is not fasting today, requests labs to be sent to [facility].  He has a PMHx of HLD, CAD, STEMI to LAD s/p PCI to one vessel, did see cardiology in the past in [place] saw "Branch" and a lady - "NP [facility]ails".  PMHx also noted to include LV systolic dysfunction w/o HF. He is on ASA, atorvastatin, Carvedilol, lisinopril.  He denies any CP, SOB, orthopnea, LE edema. He denies hx of DM. Reports compliance with meds w/o side effects.  He believes cardiology dicharged him.  Per chart review last labs done 07/09/17, good kidney function, elevated triglycerides.  Last Cardiology visit 05/2017 for annual visit.  STEMI 08/2015 had drug eluding  stent to LAD.  08/2015 ECHO with EF 45-50%, diffuse HK, G1DD.  No PCP at last cardiology visit last year.    Patient Active Problem List   Diagnosis Date Noted  . Left ventricular systolic dysfunction without heart failure   . HLD (hyperlipidemia)   . CAD (coronary artery disease)   . ST elevation (STEMI) myocardial infarction involving left anterior descending coronary artery ([place]) 09/19/2015     Prior to Admission medications   Medication Sig Start Date End Date Taking? Authorizing Provider  aspirin 81 MG chewable tablet Chew 1 tablet (81 mg total) by mouth daily. 09/21/15  Yes Eileen Stanford, PA-C  atorvastatin (LIPITOR) 40 MG tablet TAKE 1 TABLET BY MOUTH EVERY DAY 03/19/18  Yes Branch, Alphonse Guild, MD  carvedilol (COREG) 3.125 MG tablet TAKE 1 TABLET BY MOUTH TWICE DAILY WITH MEALS 04/19/18  Yes Lendon Colonel, NP  lisinopril (PRINIVIL,ZESTRIL) 5 MG tablet Take 1 tablet (5 mg total) by mouth daily. 08/20/17  Yes Lendon Colonel, NP     No Known Allergies   Family History  Problem Relation Age of Onset  . Stroke Father   . Heart attack Father      Social History   Socioeconomic History  . Marital status: Married    Spouse name: Not on file  .  Number of children: Not on file  . Years of education: Not on file  . Highest education level: Not on file  Occupational History  . Not on file  Social Needs  . Financial resource strain: Not on file  . Food insecurity:    Worry: Not on file    Inability: Not on file  . Transportation needs:    Medical: Not on file    Non-medical: Not on file  Tobacco Use  . Smoking status: Never Smoker  . Smokeless tobacco: Never Used  Substance and Sexual Activity  . Alcohol use: Yes    Alcohol/week: 0.0 standard drinks    Comment: 4-5 beers daily  . Drug use: No  . Sexual activity: Yes  Lifestyle  . Physical activity:    Days per week: Not on file    Minutes per session: Not on file  . Stress: Not on file    Relationships  . Social connections:    Talks on phone: Not on file    Gets together: Not on file    Attends religious service: Not on file    Active member of club or organization: Not on file    Attends meetings of clubs or organizations: Not on file    Relationship status: Not on file  . Intimate partner violence:    Fear of current or ex partner: Not on file    Emotionally abused: Not on file    Physically abused: Not on file    Forced sexual activity: Not on file  Other Topics Concern  . Not on file  Social History Narrative  . Not on file     Review of Systems  Constitutional: Negative.  Negative for activity change, appetite change, fatigue and unexpected weight change.  HENT: Negative.   Eyes: Negative.   Respiratory: Negative.  Negative for shortness of breath.   Cardiovascular: Negative.  Negative for chest pain, palpitations and leg swelling.  Gastrointestinal: Negative.  Negative for abdominal pain and blood in stool.  Endocrine: Negative.   Genitourinary: Negative.  Negative for decreased urine volume, difficulty urinating, testicular pain and urgency.  Musculoskeletal: Negative.   Skin: Negative.  Negative for color change and pallor.  Allergic/Immunologic: Negative.   Neurological: Negative.  Negative for syncope, weakness, light-headedness and numbness.  Psychiatric/Behavioral: Negative.  Negative for confusion, dysphoric mood, self-injury and suicidal ideas. The patient is not nervous/anxious.   All other systems reviewed and are negative.      Objective:    Vitals:   07/05/18 1030  BP: 138/78  Pulse: 74  Resp: 18  Temp: 98.2 F (36.8 C)  TempSrc: Oral  SpO2: 97%  Weight: 205 lb (93 kg)  Height: 5\' 9"  (1.753 m)      Physical Exam  Constitutional: He is oriented to person, place, and time. He appears well-developed and well-nourished.  Non-toxic appearance. He does not appear ill. No distress.  HENT:  Head: Normocephalic and atraumatic.   Right Ear: Tympanic membrane, external ear and ear canal normal.  Left Ear: Tympanic membrane, external ear and ear canal normal.  Nose: No mucosal edema or rhinorrhea. Right sinus exhibits no maxillary sinus tenderness and no frontal sinus tenderness. Left sinus exhibits no maxillary sinus tenderness and no frontal sinus tenderness.  Mouth/Throat: Uvula is midline. No trismus in the jaw. No uvula swelling. No oropharyngeal exudate, posterior oropharyngeal edema or posterior oropharyngeal erythema.  Eyes: Pupils are equal, round, and reactive to light. Conjunctivae, EOM and lids are normal.  Neck: Trachea normal, normal range of motion and phonation normal. Neck supple. No tracheal deviation present.  Cardiovascular: Normal rate, regular rhythm, normal heart sounds, intact distal pulses and normal pulses. Exam reveals no gallop and no friction rub.  No murmur heard. Pulses:      Radial pulses are 2+ on the right side, and 2+ on the left side.       Posterior tibial pulses are 2+ on the right side, and 2+ on the left side.  Pulmonary/Chest: Effort normal and breath sounds normal. He has no wheezes. He has no rhonchi. He has no rales.  Abdominal: Soft. Normal appearance and bowel sounds are normal. He exhibits no distension and no mass. There is no tenderness. There is no rigidity, no rebound and no guarding. A hernia is present. Hernia confirmed positive in the right inguinal area. Hernia confirmed negative in the left inguinal area.  Genitourinary: Testes normal and penis normal. Circumcised.  Genitourinary Comments: Chaperone by Vonna Kotyk CMA  Musculoskeletal: Normal range of motion. He exhibits no edema.  Lymphadenopathy: No inguinal adenopathy noted on the right or left side.  Neurological: He is alert and oriented to person, place, and time. Gait normal.  Skin: Skin is warm, dry and intact. Capillary refill takes less than 2 seconds. No rash noted. He is not diaphoretic.  Psychiatric:  He has a normal mood and affect. His speech is normal and behavior is normal.  Nursing note and vitals reviewed.         Assessment & Plan:      ICD-10-CM   1. Coronary artery disease involving native heart without angina pectoris, unspecified vessel or lesion type I25.10   2. Right inguinal hernia K40.90 Ambulatory referral to General Surgery  3. Erectile dysfunction, unspecified erectile dysfunction type N52.9 PSA    Testosterone    Urinalysis, Routine w reflex microscopic  4. Encounter to establish care with new doctor Z76.89   5. General medical exam Z00.00 CBC with Differential/Platelet    Lipid panel    PSA    Comprehensive metabolic panel    Urinalysis, Routine w reflex microscopic   for labs - CPE to be done at f/up appt with labs resulted  6. Hyperlipidemia, unspecified hyperlipidemia type E78.5 CBC with Differential/Platelet    Lipid panel    Comprehensive metabolic panel  7. Essential hypertension I10 CBC with Differential/Platelet    Lipid panel    Comprehensive metabolic panel  8. Class 1 obesity with body mass index (BMI) of 30.0 to 30.9 in adult, unspecified obesity type, unspecified whether serious comorbidity present E66.9 Hemoglobin A1c   Z68.30     Pt new to establish here, no PCP for several years, STEMI to LAD 3 years ago, seems to have only followed with cardiology until last year.  Presents with concerns of right groin mass, suspects hernia and I agree, suspect direct right inguinal hernia, not firm, minimally tender - referred to general surgery, with request of Dr. Zella Richer.  Hx of left inguinal hernia 20+ years ago.    Basic labs due, is obese with other PMHx of HLD, HTN, CAD - discussed fasting labs with pt and returning for CPE to review indepth current conditions, wellness, screening, etc.  He requests labs be done at [facility] so he can do fasting labs and not miss work.  Did tell him I would have to look up and order labs for next week.  He asked  about ED meds, will obtain PSA, testosterone and  UA for screening.  With his cardiac history will have to review trends of BP and possibly refer out to urology to treat, and/or clear with cardiology.     Delsa Grana, PA-C 07/05/18 10:51 AM

## 2018-07-09 DIAGNOSIS — E669 Obesity, unspecified: Secondary | ICD-10-CM | POA: Insufficient documentation

## 2018-07-09 DIAGNOSIS — Z6830 Body mass index (BMI) 30.0-30.9, adult: Secondary | ICD-10-CM

## 2018-07-09 DIAGNOSIS — I10 Essential (primary) hypertension: Secondary | ICD-10-CM | POA: Insufficient documentation

## 2018-07-09 DIAGNOSIS — K4090 Unilateral inguinal hernia, without obstruction or gangrene, not specified as recurrent: Secondary | ICD-10-CM | POA: Insufficient documentation

## 2018-07-09 DIAGNOSIS — N529 Male erectile dysfunction, unspecified: Secondary | ICD-10-CM | POA: Insufficient documentation

## 2018-07-10 ENCOUNTER — Other Ambulatory Visit (HOSPITAL_COMMUNITY)
Admission: RE | Admit: 2018-07-10 | Discharge: 2018-07-10 | Disposition: A | Discharge status: Home / Self Care | Payer: Managed Care, Other (non HMO) | Source: Ambulatory Visit | Attending: Family Medicine | Admitting: Family Medicine

## 2018-07-10 DIAGNOSIS — E669 Obesity, unspecified: Secondary | ICD-10-CM | POA: Diagnosis present

## 2018-07-10 DIAGNOSIS — Z6830 Body mass index (BMI) 30.0-30.9, adult: Secondary | ICD-10-CM | POA: Insufficient documentation

## 2018-07-10 LAB — CBC WITH DIFFERENTIAL/PLATELET
BASOS PCT: 1 %
Basophils Absolute: 0.1 10*3/uL (ref 0.0–0.1)
Eosinophils Absolute: 0.2 10*3/uL (ref 0.0–0.7)
Eosinophils Relative: 4 %
HEMATOCRIT: 44.8 % (ref 39.0–52.0)
Hemoglobin: 15.5 g/dL (ref 13.0–17.0)
Lymphocytes Relative: 35 %
Lymphs Abs: 2.1 10*3/uL (ref 0.7–4.0)
MCH: 34.1 pg — ABNORMAL HIGH (ref 26.0–34.0)
MCHC: 34.6 g/dL (ref 30.0–36.0)
MCV: 98.5 fL (ref 78.0–100.0)
MONO ABS: 0.7 10*3/uL (ref 0.1–1.0)
MONOS PCT: 11 %
Neutro Abs: 3 10*3/uL (ref 1.7–7.7)
Neutrophils Relative %: 49 %
Platelets: 183 10*3/uL (ref 150–400)
RBC: 4.55 MIL/uL (ref 4.22–5.81)
RDW: 13 % (ref 11.5–15.5)
WBC: 6 10*3/uL (ref 4.0–10.5)

## 2018-07-10 LAB — LIPID PANEL
CHOLESTEROL: 156 mg/dL (ref 0–200)
HDL: 63 mg/dL (ref >40)
LDL Cholesterol: 38 mg/dL (ref 0–99)
TRIGLYCERIDES: 274 mg/dL — AB (ref <150)
Total CHOL/HDL Ratio: 2.5 RATIO
VLDL: 55 mg/dL — ABNORMAL HIGH (ref 0–40)

## 2018-07-10 LAB — COMPREHENSIVE METABOLIC PANEL
ALK PHOS: 33 U/L — AB (ref 38–126)
ALT: 34 U/L (ref 0–44)
ANION GAP: 11 (ref 5–15)
AST: 40 U/L (ref 15–41)
Albumin: 4 g/dL (ref 3.5–5.0)
BUN: 11 mg/dL (ref 6–20)
CALCIUM: 9.2 mg/dL (ref 8.9–10.3)
CHLORIDE: 102 mmol/L (ref 98–111)
CO2: 24 mmol/L (ref 22–32)
Creatinine, Ser: 0.84 mg/dL (ref 0.61–1.24)
GFR calc non Af Amer: >60 mL/min (ref >60)
Glucose, Bld: 105 mg/dL — ABNORMAL HIGH (ref 70–99)
POTASSIUM: 4.8 mmol/L (ref 3.5–5.1)
Sodium: 137 mmol/L (ref 135–145)
Total Bilirubin: 1 mg/dL (ref 0.3–1.2)
Total Protein: 7.4 g/dL (ref 6.5–8.1)

## 2018-07-10 LAB — URINALYSIS, ROUTINE W REFLEX MICROSCOPIC
Bilirubin Urine: NEGATIVE
GLUCOSE, UA: NEGATIVE mg/dL
Hgb urine dipstick: NEGATIVE
KETONES UR: NEGATIVE mg/dL
LEUKOCYTES UA: NEGATIVE
NITRITE: NEGATIVE
PH: 5 (ref 5.0–8.0)
PROTEIN: NEGATIVE mg/dL
SPECIFIC GRAVITY, URINE: 1.013 (ref 1.005–1.030)

## 2018-07-10 LAB — HEMOGLOBIN A1C
Hgb A1c MFr Bld: 5.3 % (ref 4.8–5.6)
Mean Plasma Glucose: 105.41 mg/dL

## 2018-07-10 LAB — PSA: Prostatic Specific Antigen: 0.74 ng/mL (ref 0.00–4.00)

## 2018-07-11 ENCOUNTER — Encounter: Payer: Self-pay | Admitting: Family Medicine

## 2018-07-11 LAB — TESTOSTERONE: TESTOSTERONE: 301 ng/dL (ref 264–916)

## 2018-07-12 ENCOUNTER — Ambulatory Visit (INDEPENDENT_AMBULATORY_CARE_PROVIDER_SITE_OTHER): Payer: Managed Care, Other (non HMO) | Admitting: Family Medicine

## 2018-07-12 ENCOUNTER — Encounter: Payer: Self-pay | Admitting: Family Medicine

## 2018-07-12 VITALS — BP 132/74 | HR 80 | Temp 98.2°F | Resp 18 | Ht 69.0 in | Wt 209.0 lb

## 2018-07-12 DIAGNOSIS — Z1211 Encounter for screening for malignant neoplasm of colon: Secondary | ICD-10-CM

## 2018-07-12 DIAGNOSIS — E785 Hyperlipidemia, unspecified: Secondary | ICD-10-CM

## 2018-07-12 DIAGNOSIS — Z114 Encounter for screening for human immunodeficiency virus [HIV]: Secondary | ICD-10-CM

## 2018-07-12 DIAGNOSIS — Z0000 Encounter for general adult medical examination without abnormal findings: Secondary | ICD-10-CM | POA: Diagnosis not present

## 2018-07-12 DIAGNOSIS — E781 Pure hyperglyceridemia: Secondary | ICD-10-CM | POA: Insufficient documentation

## 2018-07-12 DIAGNOSIS — N529 Male erectile dysfunction, unspecified: Secondary | ICD-10-CM

## 2018-07-12 DIAGNOSIS — E669 Obesity, unspecified: Secondary | ICD-10-CM

## 2018-07-12 DIAGNOSIS — I10 Essential (primary) hypertension: Secondary | ICD-10-CM

## 2018-07-12 DIAGNOSIS — Z6830 Body mass index (BMI) 30.0-30.9, adult: Secondary | ICD-10-CM

## 2018-07-12 DIAGNOSIS — Z1159 Encounter for screening for other viral diseases: Secondary | ICD-10-CM

## 2018-07-12 NOTE — Patient Instructions (Signed)
Health Maintenance, Male A healthy lifestyle and preventive care is important for your health and wellness. Ask your health care provider about what schedule of regular examinations is right for you. What should I know about weight and diet? Eat a Healthy Diet  Eat plenty of vegetables, fruits, whole grains, low-fat dairy products, and lean protein.  Do not eat a lot of foods high in solid fats, added sugars, or salt.  Maintain a Healthy Weight Regular exercise can help you achieve or maintain a healthy weight. You should:  Do at least 150 minutes of exercise each week. The exercise should increase your heart rate and make you sweat (moderate-intensity exercise).  Do strength-training exercises at least twice a week.  Watch Your Levels of Cholesterol and Blood Lipids  Have your blood tested for lipids and cholesterol every 5 years starting at 61 years of age. If you are at high risk for heart disease, you should start having your blood tested when you are 61 years old. You may need to have your cholesterol levels checked more often if: ? Your lipid or cholesterol levels are high. ? You are older than 61 years of age. ? You are at high risk for heart disease.  What should I know about cancer screening? Many types of cancers can be detected early and may often be prevented. Lung Cancer  You should be screened every year for lung cancer if: ? You are a current smoker who has smoked for at least 30 years. ? You are a former smoker who has quit within the past 15 years.  Talk to your health care provider about your screening options, when you should start screening, and how often you should be screened.  Colorectal Cancer  Routine colorectal cancer screening usually begins at 61 years of age and should be repeated every 5-10 years until you are 61 years old. You may need to be screened more often if early forms of precancerous polyps or small growths are found. Your health care  provider may recommend screening at an earlier age if you have risk factors for colon cancer.  Your health care provider may recommend using home test kits to check for hidden blood in the stool.  A small camera at the end of a tube can be used to examine your colon (sigmoidoscopy or colonoscopy). This checks for the earliest forms of colorectal cancer.  Prostate and Testicular Cancer  Depending on your age and overall health, your health care provider may do certain tests to screen for prostate and testicular cancer.  Talk to your health care provider about any symptoms or concerns you have about testicular or prostate cancer.  Skin Cancer  Check your skin from head to toe regularly.  Tell your health care provider about any new moles or changes in moles, especially if: ? There is a change in a mole's size, shape, or color. ? You have a mole that is larger than a pencil eraser.  Always use sunscreen. Apply sunscreen liberally and repeat throughout the day.  Protect yourself by wearing long sleeves, pants, a wide-brimmed hat, and sunglasses when outside.  What should I know about heart disease, diabetes, and high blood pressure?  If you are 44-22 years of age, have your blood pressure checked every 3-5 years. If you are 63 years of age or older, have your blood pressure checked every year. You should have your blood pressure measured twice-once when you are at a hospital or clinic, and once  when you are not at a hospital or clinic. Record the average of the two measurements. To check your blood pressure when you are not at a hospital or clinic, you can use: ? An automated blood pressure machine at a pharmacy. ? A home blood pressure monitor.  Talk to your health care provider about your target blood pressure.  If you are between 4-52 years old, ask your health care provider if you should take aspirin to prevent heart disease.  Have regular diabetes screenings by checking your  fasting blood sugar level. ? If you are at a normal weight and have a low risk for diabetes, have this test once every three years after the age of 50. ? If you are overweight and have a high risk for diabetes, consider being tested at a younger age or more often.  A one-time screening for abdominal aortic aneurysm (AAA) by ultrasound is recommended for men aged 52-75 years who are current or former smokers. What should I know about preventing infection? Hepatitis B If you have a higher risk for hepatitis B, you should be screened for this virus. Talk with your health care provider to find out if you are at risk for hepatitis B infection. Hepatitis C Blood testing is recommended for:  Everyone born from [birth year] through 1964.  Anyone with known risk factors for hepatitis C.  Sexually Transmitted Diseases (STDs)  You should be screened each year for STDs including gonorrhea and chlamydia if: ? You are sexually active and are younger than 61 years of age. ? You are older than 61 years of age and your health care provider tells you that you are at risk for this type of infection. ? Your sexual activity has changed since you were last screened and you are at an increased risk for chlamydia or gonorrhea. Ask your health care provider if you are at risk.  Talk with your health care provider about whether you are at high risk of being infected with HIV. Your health care provider may recommend a prescription medicine to help prevent HIV infection.  What else can I do?  Schedule regular health, dental, and eye exams.  Stay current with your vaccines (immunizations).  Do not use any tobacco products, such as cigarettes, chewing tobacco, and e-cigarettes. If you need help quitting, ask your health care provider.  Limit alcohol intake to no more than 2 drinks per day. One drink equals 12 ounces of beer, 5 ounces of wine, or 1 ounces of hard liquor.  Do not use street drugs.  Do not share  needles.  Ask your health care provider for help if you need support or information about quitting drugs.  Tell your health care provider if you often feel depressed.  Tell your health care provider if you have ever been abused or do not feel safe at home. This information is not intended to replace advice given to you by your health care provider. Make sure you discuss any questions you have with your health care provider. Document Released: 05/11/2008 Document Revised: 07/12/2016 Document Reviewed: 08/17/2015 Elsevier Interactive Patient Education  [employer].   Colonoscopy, Adult A colonoscopy is an exam to look at the entire large intestine. During the exam, a lubricated, bendable tube is inserted into the anus and then passed into the rectum, colon, and other parts of the large intestine. A colonoscopy is often done as a part of normal colorectal screening or in response to certain symptoms, such as anemia, persistent  diarrhea, abdominal pain, and blood in the stool. The exam can help screen for and diagnose medical problems, including:  Tumors.  Polyps.  Inflammation.  Areas of bleeding.  Tell a health care provider about:  Any allergies you have.  All medicines you are taking, including vitamins, herbs, eye drops, creams, and over-the-counter medicines.  Any problems you or family members have had with anesthetic medicines.  Any blood disorders you have.  Any surgeries you have had.  Any medical conditions you have.  Any problems you have had passing stool. What are the risks? Generally, this is a safe procedure. However, problems may occur, including:  Bleeding.  A tear in the intestine.  A reaction to medicines given during the exam.  Infection (rare).  What happens before the procedure? Eating and drinking restrictions Follow instructions from your health care provider about eating and drinking, which may include:  A few days before the  procedure - follow a low-fiber diet. Avoid nuts, seeds, dried fruit, raw fruits, and vegetables.  1-3 days before the procedure - follow a clear liquid diet. Drink only clear liquids, such as clear broth or bouillon, black coffee or tea, clear juice, clear soft drinks or sports drinks, gelatin dessert, and popsicles. Avoid any liquids that contain red or purple dye.  On the day of the procedure - do not eat or drink anything during the 2 hours before the procedure, or within the time period that your health care provider recommends.  Bowel prep If you were prescribed an oral bowel prep to clean out your colon:  Take it as told by your health care provider. Starting the day before your procedure, you will need to drink a large amount of medicated liquid. The liquid will cause you to have multiple loose stools until your stool is almost clear or light green.  If your skin or anus gets irritated from diarrhea, you may use these to relieve the irritation: ? Medicated wipes, such as adult wet wipes with aloe and vitamin E. ? A skin soothing-product like petroleum jelly.  If you vomit while drinking the bowel prep, take a break for up to 60 minutes and then begin the bowel prep again. If vomiting continues and you cannot take the bowel prep without vomiting, call your health care provider.  General instructions  Ask your health care provider about changing or stopping your regular medicines. This is especially important if you are taking diabetes medicines or blood thinners.  Plan to have someone take you home from the hospital or clinic. What happens during the procedure?  An IV tube may be inserted into one of your veins.  You will be given medicine to help you relax (sedative).  To reduce your risk of infection: ? Your health care team will wash or sanitize their hands. ? Your anal area will be washed with soap.  You will be asked to lie on your side with your knees bent.  Your health  care provider will lubricate a long, thin, flexible tube. The tube will have a camera and a light on the end.  The tube will be inserted into your anus.  The tube will be gently eased through your rectum and colon.  Air will be delivered into your colon to keep it open. You may feel some pressure or cramping.  The camera will be used to take images during the procedure.  A small tissue sample may be removed from your body to be examined under a microscope (  biopsy). If any potential problems are found, the tissue will be sent to a lab for testing.  If small polyps are found, your health care provider may remove them and have them checked for cancer cells.  The tube that was inserted into your anus will be slowly removed. The procedure may vary among health care providers and hospitals. What happens after the procedure?  Your blood pressure, heart rate, breathing rate, and blood oxygen level will be monitored until the medicines you were given have worn off.  Do not drive for 24 hours after the exam.  You may have a small amount of blood in your stool.  You may pass gas and have mild abdominal cramping or bloating due to the air that was used to inflate your colon during the exam.  It is up to you to get the results of your procedure. Ask your health care provider, or the department performing the procedure, when your results will be ready. This information is not intended to replace advice given to you by your health care provider. Make sure you discuss any questions you have with your health care provider. Document Released: 11/10/2000 Document Revised: 09/13/2016 Document Reviewed: 01/25/2016 Elsevier Interactive Patient Education  2018 [employer].

## 2018-07-12 NOTE — Progress Notes (Signed)
Patient: Devin Gardner, Male    DOB: Mar 14, 1956, 62 y.o.   MRN: [id number] Visit Date: 07/12/2018  Today's Provider: Delsa Grana, PA-C   Chief Complaint  Patient presents with  . Annual Exam   Subjective:    Annual physical exam Devin Gardner is a 62 y.o. male who presents today for health maintenance and complete physical. He feels well. He reports exercising not at all, but states he walks a lot all day long at work. He reports he is sleeping well.  ----------------------------------------------------------------- Family hx of early onset MI in father in his 27's, passed away in his 19's from heart disease and stroke.  Pt previously concerned about Erectile Dysfunction.  Basic labs reviewed with PSA and Testosterone - normal.  Pt does not have much to say about this today.    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  No cardiology eval this year.  He reports he is taking all medications as prescribed -baby aspirin, 40 mg Lipitor, 3.125 carvedilol and 5 mg lisinopril.  He has not seen cardiology since last summer, he does not have any appointment scheduled.  He continues to drink about 5-6 beers a day, no smoking history.  Not had any screening done in the past for Hemoccult or a colonoscopy.  He did get his labs done at [facility] earlier this week.  Discussed that he is due for hepatitis C screening and HIV screening which he agreed to initially and then asked to leave suddenly and did not want to do labs.    His blood work showed elevated triglycerides.  He reports compliance to his Lipitor and states that he has try to cut out sugary drinks but he needs to do more with his diet.    He is going on vacation and out of the country in about 2 months and when discussing colonoscopies or repeated blood work he does not  want to do any of that he says because he is going on vacation.  He is overdue for Tdap, does not believe he is ever had shingles vaccine.  He does not want to do this today either.  Review of Systems  Constitutional: Negative.  Negative for activity change, appetite change, fatigue and unexpected weight change.  HENT: Negative.  Negative for trouble swallowing.   Eyes: Negative.   Respiratory: Negative.  Negative for shortness of breath.   Cardiovascular: Negative.  Negative for chest pain, palpitations and leg swelling.  Gastrointestinal: Negative.  Negative for abdominal pain and blood in stool.  Endocrine: Negative.   Genitourinary: Negative.  Negative for decreased urine volume, difficulty urinating, testicular pain and urgency.  Musculoskeletal: Negative.   Skin: Negative.  Negative for color change and pallor.  Allergic/Immunologic: Negative.   Neurological: Negative.  Negative for syncope, weakness, light-headedness and numbness.  Psychiatric/Behavioral: Negative.  Negative for confusion, dysphoric mood, self-injury and suicidal ideas. The patient is not nervous/anxious.   All other systems reviewed and are negative.      Social History      He  reports that he has never smoked. He has never used smokeless tobacco. He reports that he drinks alcohol. He reports that he does not use drugs.       Social History   Socioeconomic History  . Marital status: Married  Spouse name: Not on file  . Number of children: Not on file  . Years of education: Not on file  . Highest education level: Not on file  Occupational History  . Not on file  Social Needs  . Financial resource strain: Not on file  . Food insecurity:    Worry: Not on file    Inability: Not on file  . Transportation needs:    Medical: Not on file    Non-medical: Not on file  Tobacco Use  . Smoking status: Never Smoker  . Smokeless tobacco: Never Used  Substance and Sexual Activity  . Alcohol use: Yes     Alcohol/week: 5.0 standard drinks    Types: 5 Cans of beer per week    Comment: 4-5 beers daily  . Drug use: No  . Sexual activity: Yes  Lifestyle  . Physical activity:    Days per week: 5 days    Minutes per session: 30 min  . Stress: Not on file  Relationships  . Social connections:    Talks on phone: Not on file    Gets together: Not on file    Attends religious service: Not on file    Active member of club or organization: Not on file    Attends meetings of clubs or organizations: Not on file    Relationship status: Not on file  Other Topics Concern  . Not on file  Social History Narrative  . Not on file    Past Medical History:  Diagnosis Date  . CAD (coronary artery disease)    a. 09/19/15 ant STEMI s/p DES to ost LAD. Otherwise mild disease affecting the RCA and LCX.   Marland Kitchen HLD (hyperlipidemia)   . Left ventricular systolic dysfunction without heart failure    a. mild. 08/2015 2D ECHO with EF 45-50%, diffuse HK, G1DD     Patient Active Problem List   Diagnosis Date Noted  . Erectile dysfunction 07/09/2018  . Right inguinal hernia 07/09/2018  . Essential hypertension 07/09/2018  . Class 1 obesity with body mass index (BMI) of 30.0 to 30.9 in adult 07/09/2018  . Left ventricular systolic dysfunction without heart failure   . HLD (hyperlipidemia)   . CAD (coronary artery disease)   . ST elevation (STEMI) myocardial infarction involving left anterior descending coronary artery ([place]) 09/19/2015    Past Surgical History:  Procedure Laterality Date  . CARDIAC CATHETERIZATION N/A 09/19/2015   Procedure: Left Heart Cath and Coronary Angiography;  Surgeon: Wellington Hampshire, MD;  Location: [facility];  Service: Cardiovascular;  Laterality: N/A;  . CARDIAC CATHETERIZATION N/A 09/19/2015   Procedure: Coronary Stent Intervention;  Surgeon: Wellington Hampshire, MD;  Location: [facility];  Service: Cardiovascular;  Laterality: N/A;  . HERNIA REPAIR    . NO PAST  SURGERIES      Family History        Family Status  Relation Name Status  . Father  Deceased  . Mother  Alive        His family history includes Heart attack in his father; Stroke in his father.      No Known Allergies   Current Outpatient Medications:  .  aspirin 81 MG chewable tablet, Chew 1 tablet (81 mg total) by mouth daily., Disp: , Rfl:  .  atorvastatin (LIPITOR) 40 MG tablet, TAKE 1 TABLET BY MOUTH EVERY DAY, Disp: 90 tablet, Rfl: 3 .  carvedilol (COREG) 3.125 MG tablet, TAKE 1 TABLET BY  MOUTH TWICE DAILY WITH MEALS, Disp: 180 tablet, Rfl: 0 .  lisinopril (PRINIVIL,ZESTRIL) 5 MG tablet, Take 1 tablet (5 mg total) by mouth daily., Disp: 90 tablet, Rfl: 3   Patient Care Team: Delsa Grana, PA-C as PCP - General (Family Medicine)      Objective:   Vitals: BP 132/74   Pulse 80   Temp 98.2 F (36.8 C) (Oral)   Resp 18   Ht 5\' 9"  (1.753 m)   Wt 209 lb (94.8 kg)   SpO2 97%   BMI 30.86 kg/m    Vitals:   07/12/18 0834  BP: 132/74  Pulse: 80  Resp: 18  Temp: 98.2 F (36.8 C)  TempSrc: Oral  SpO2: 97%  Weight: 209 lb (94.8 kg)  Height: 5\' 9"  (1.753 m)     Physical Exam  Constitutional: He is oriented to person, place, and time. He appears well-developed and well-nourished.  Non-toxic appearance. He does not appear ill. No distress.  HENT:  Head: Normocephalic and atraumatic.  Right Ear: Tympanic membrane, external ear and ear canal normal.  Left Ear: Tympanic membrane, external ear and ear canal normal.  Nose: Nose normal. No mucosal edema or rhinorrhea. Right sinus exhibits no maxillary sinus tenderness and no frontal sinus tenderness. Left sinus exhibits no maxillary sinus tenderness and no frontal sinus tenderness.  Mouth/Throat: Uvula is midline and oropharynx is clear and moist. No trismus in the jaw. No uvula swelling. No oropharyngeal exudate, posterior oropharyngeal edema or posterior oropharyngeal erythema.  Mildly HOH  Eyes: Pupils are equal, round,  and reactive to light. Conjunctivae, EOM and lids are normal. No scleral icterus.  Neck: Trachea normal, normal range of motion and phonation normal. Neck supple. No tracheal deviation present.  Cardiovascular: Normal rate, regular rhythm, normal heart sounds, intact distal pulses and normal pulses. Exam reveals no gallop and no friction rub.  No murmur heard. Pulses:      Radial pulses are 2+ on the right side, and 2+ on the left side.       Posterior tibial pulses are 2+ on the right side, and 2+ on the left side.  Pulmonary/Chest: Effort normal and breath sounds normal. No respiratory distress. He has no wheezes. He has no rhonchi. He has no rales.  Abdominal: Soft. Normal appearance and bowel sounds are normal. He exhibits no distension. There is no tenderness. There is no rigidity. Hernia confirmed negative in the left inguinal area.  Genitourinary: Testes normal and penis normal. Circumcised.  Genitourinary Comments: Chaperone by Vonna Kotyk CMA  Musculoskeletal: Normal range of motion. He exhibits no edema.  Lymphadenopathy: No inguinal adenopathy noted on the right or left side.  Neurological: He is alert and oriented to person, place, and time. Gait normal.  Skin: Skin is warm, dry and intact. Capillary refill takes less than 2 seconds. No rash noted. He is not diaphoretic. No pallor.  Psychiatric: He has a normal mood and affect. His speech is normal and behavior is normal. Judgment and thought content normal.  Nursing note and vitals reviewed.    Depression Screen PHQ 2/9 Scores 07/05/2018  PHQ - 2 Score 0      Office Visit from 07/05/2018 in [facility]  AUDIT-C Score  6     Results for orders placed or performed during the hospital encounter of 07/10/18  Hemoglobin A1c  Result Value Ref Range   Hgb A1c MFr Bld 5.3 4.8 - 5.6 %   Mean Plasma Glucose 105.41 mg/dL  Urinalysis, Routine w reflex microscopic  Result Value Ref Range   Color, Urine YELLOW  YELLOW   APPearance CLEAR CLEAR   Specific Gravity, Urine 1.013 1.005 - 1.030   pH 5.0 5.0 - 8.0   Glucose, UA NEGATIVE NEGATIVE mg/dL   Hgb urine dipstick NEGATIVE NEGATIVE   Bilirubin Urine NEGATIVE NEGATIVE   Ketones, ur NEGATIVE NEGATIVE mg/dL   Protein, ur NEGATIVE NEGATIVE mg/dL   Nitrite NEGATIVE NEGATIVE   Leukocytes, UA NEGATIVE NEGATIVE  Comprehensive metabolic panel  Result Value Ref Range   Sodium 137 135 - 145 mmol/L   Potassium 4.8 3.5 - 5.1 mmol/L   Chloride 102 98 - 111 mmol/L   CO2 24 22 - 32 mmol/L   Glucose, Bld 105 (H) 70 - 99 mg/dL   BUN 11 6 - 20 mg/dL   Creatinine, Ser 0.84 0.61 - 1.24 mg/dL   Calcium 9.2 8.9 - 10.3 mg/dL   Total Protein 7.4 6.5 - 8.1 g/dL   Albumin 4.0 3.5 - 5.0 g/dL   AST 40 15 - 41 U/L   ALT 34 0 - 44 U/L   Alkaline Phosphatase 33 (L) 38 - 126 U/L   Total Bilirubin 1.0 0.3 - 1.2 mg/dL   GFR calc non Af Amer >60 >60 mL/min   GFR calc Af Amer >60 >60 mL/min   Anion gap 11 5 - 15  Testosterone  Result Value Ref Range   Testosterone 301 264 - 916 ng/dL  PSA  Result Value Ref Range   Prostatic Specific Antigen 0.74 0.00 - 4.00 ng/mL  Lipid panel  Result Value Ref Range   Cholesterol 156 0 - 200 mg/dL   Triglycerides 274 (H) <150 mg/dL   HDL 63 >40 mg/dL   Total CHOL/HDL Ratio 2.5 RATIO   VLDL 55 (H) 0 - 40 mg/dL   LDL Cholesterol 38 0 - 99 mg/dL  CBC with Differential/Platelet  Result Value Ref Range   WBC 6.0 4.0 - 10.5 K/uL   RBC 4.55 4.22 - 5.81 MIL/uL   Hemoglobin 15.5 13.0 - 17.0 g/dL   HCT 44.8 39.0 - 52.0 %   MCV 98.5 78.0 - 100.0 fL   MCH 34.1 (H) 26.0 - 34.0 pg   MCHC 34.6 30.0 - 36.0 g/dL   RDW 13.0 11.5 - 15.5 %   Platelets 183 150 - 400 K/uL   Neutrophils Relative % 49 %   Neutro Abs 3.0 1.7 - 7.7 K/uL   Lymphocytes Relative 35 %   Lymphs Abs 2.1 0.7 - 4.0 K/uL   Monocytes Relative 11 %   Monocytes Absolute 0.7 0.1 - 1.0 K/uL   Eosinophils Relative 4 %   Eosinophils Absolute 0.2 0.0 - 0.7 K/uL   Basophils  Relative 1 %   Basophils Absolute 0.1 0.0 - 0.1 K/uL    Assessment & Plan:     Routine Health Maintenance and Physical Exam  Exercise Activities and Dietary recommendations Goals    . DIET - REDUCE SUGAR INTAKE     Reduce sugar intake, avoid high glycemic index foods, reduce calories overall        There is no immunization history on file for this patient.  Health Maintenance  Topic Date Due  . Hepatitis C Screening  July 08, 1956  . HIV Screening  10/19/1972  . TETANUS/TDAP  10/19/1976  . COLONOSCOPY  10/20/2007  . INFLUENZA VACCINE  06/27/2018   Tdap - refuses He also refuses Hep C and HIV screening today, and wants to  push it back several months. He does not want to get a colonoscopy, even though I have told him the referral goes to GI and they usually schedule several months in advance so he can make it convenient for him. He has not been to cardiology in over a year, I have asked him to follow up with them for his CAD/MI with strong family hx of early MI in his father in his 62's and passed away in his 61's.    Discussed elevated triglycerides from lab work - educated about diet changes and exercise - printed materials about triglycerides were given to the pt, as well as print out of remaining lab work.    ED - to go to urology to work up and manage.  With his hesitance to go to appts/doctors or do his screenings, I hesitate to prescribe viagra with his other cardiac hx, and unknown pattern of BP control/med compliance etc.  Discussed health benefits of physical activity, and encouraged him to engage in regular exercise appropriate for his age and condition.    -------------------------------------------------------------------- CMP, CBC, FLP in 4 months F/up with cardiology - due to CAD Urology for ED concerns GI for colonoscopy  Return for routine visit in 4 months       ICD-10-CM   1. Routine general medical examination at a health care facility Z00.00   2.  High triglycerides E78.1 Lipid panel   diet and exercise, low glycemic index foods, recheck FLP in 4 months Decrease ETOH  3. Colon cancer screening Z12.11 Ambulatory referral to Gastroenterology  4. Screening for HIV (human immunodeficiency virus) Z11.4 HIV antibody    CANCELED: HIV antibody   pt refused - would like to do with next blood draw in 4 months at [place]. Need for hepatitis C screening test Z11.59 Hepatitis C antibody    CANCELED: Hepatitis C antibody   pt refused - would like to do with next blood draw in 4 months at [place]. Erectile dysfunction, unspecified erectile dysfunction type N52.9 Ambulatory referral to Urology  7. Class 1 obesity with body mass index (BMI) of 30.0 to 30.9 in adult, unspecified obesity type, unspecified whether serious comorbidity present E66.9 CBC with Differential/Platelet   Z68.30 Lipid panel    Comprehensive metabolic panel   diet and exercise  8. Essential hypertension I10 CBC with Differential/Platelet    Comprehensive metabolic panel   163/84 today, con't meds DASH, exercise and diet, weight loss would help Advised decrease ETOH as well  9. Hyperlipidemia, unspecified hyperlipidemia type E78.5 Lipid panel   total cholesterol normal, HDL at goal, LDL at goal, continue lipitor 40 mg daily       Delsa Grana, PA-C 07/12/18 9:19 AM  [facility]

## 2018-07-17 ENCOUNTER — Encounter: Payer: Self-pay | Admitting: Internal Medicine

## 2018-07-20 ENCOUNTER — Other Ambulatory Visit: Payer: Self-pay | Admitting: Adult Health

## 2018-08-22 ENCOUNTER — Other Ambulatory Visit: Payer: Self-pay | Admitting: Adult Health

## 2018-08-26 ENCOUNTER — Ambulatory Visit: Payer: Managed Care, Other (non HMO) | Admitting: Cardiology

## 2018-08-26 ENCOUNTER — Encounter: Payer: Self-pay | Admitting: Cardiology

## 2018-08-26 VITALS — BP 112/68 | HR 90 | Ht 70.0 in | Wt 209.0 lb

## 2018-08-26 DIAGNOSIS — E782 Mixed hyperlipidemia: Secondary | ICD-10-CM | POA: Diagnosis not present

## 2018-08-26 DIAGNOSIS — I2510 Atherosclerotic heart disease of native coronary artery without angina pectoris: Secondary | ICD-10-CM | POA: Diagnosis not present

## 2018-08-26 DIAGNOSIS — I10 Essential (primary) hypertension: Secondary | ICD-10-CM | POA: Diagnosis not present

## 2018-08-26 DIAGNOSIS — Z01810 Encounter for preprocedural cardiovascular examination: Secondary | ICD-10-CM | POA: Diagnosis not present

## 2018-08-26 MED ORDER — CARVEDILOL 3.125 MG PO TABS: 3.1250 mg | ORAL_TABLET | Freq: Two times a day (BID) | ORAL | 3 refills | Status: DC

## 2018-08-26 MED ORDER — LISINOPRIL 5 MG PO TABS: ORAL_TABLET | ORAL | 3 refills | Status: DC

## 2018-08-26 MED ORDER — ATORVASTATIN CALCIUM 40 MG PO TABS: 40.0000 mg | ORAL_TABLET | Freq: Every day | ORAL | 3 refills | Status: DC

## 2018-08-26 NOTE — Progress Notes (Signed)
Clinical Summary Devin Gardner is a 62 y.o.male seen today for follow up of the following medica problems.   1. CAD - anterior STEMI 08/2015, received DES to LAD.  - 08/2015 echo LVEF 36-46%, grade I diastolic dysfunction  - no recent chest pain. No SOB or DOE - highest level of activity is yardwork. Can use push mower more than 1 hour without troubles. Walks up flights of stairs without troubles regularly.  - compliant with meds   2. HTN - he is compliant with meds  3. Hyperlipidemia - 06/2018 TC 156 TG 274 HDL 63 LDL 38   4. Preoperative evaluation - seen by [place] [facility], considering hernia surgery      SH: works for [employer].  Upcoming trip to [place] with girlfriend.  Past Medical History:  Diagnosis Date  . CAD (coronary artery disease)    a. 09/19/15 ant STEMI s/p DES to ost LAD. Otherwise mild disease affecting the RCA and LCX.   Marland Kitchen HLD (hyperlipidemia)   . Left ventricular systolic dysfunction without heart failure    a. mild. 08/2015 2D ECHO with EF 45-50%, diffuse HK, G1DD  . Myocardial infarction ([place])      No Known Allergies   Current Outpatient Medications  Medication Sig Dispense Refill  . aspirin 81 MG chewable tablet Chew 1 tablet (81 mg total) by mouth daily.    Marland Kitchen atorvastatin (LIPITOR) 40 MG tablet TAKE 1 TABLET BY MOUTH EVERY DAY 90 tablet 3  . carvedilol (COREG) 3.125 MG tablet Take 1 tablet (3.125 mg total) by mouth 2 (two) times daily. NEED OV. 180 tablet 0  . lisinopril (PRINIVIL,ZESTRIL) 5 MG tablet TAKE 1 TABLET(5 MG) BY MOUTH DAILY 90 tablet 3   No current facility-administered medications for this visit.      Past Surgical History:  Procedure Laterality Date  . CARDIAC CATHETERIZATION N/A 09/19/2015   Procedure: Left Heart Cath and Coronary Angiography;  Surgeon: Wellington Hampshire, MD;  Location: [facility];  Service: Cardiovascular;  Laterality: N/A;  . CARDIAC CATHETERIZATION N/A 09/19/2015   Procedure: Coronary Stent Intervention;  Surgeon: Wellington Hampshire, MD;  Location: [facility];  Service: Cardiovascular;  Laterality: N/A;  . HERNIA REPAIR    . NO PAST SURGERIES       No Known Allergies    Family History  Problem Relation Age of Onset  . Stroke Father   . Heart attack Father 85       3 MI's died in 7's     Social History Mr. Sappenfield reports that he has never smoked. He has never used smokeless tobacco. Mr. Heslin reports that he drinks about 5.0 standard drinks of alcohol per week.   Review of Systems CONSTITUTIONAL: No weight loss, fever, chills, weakness or fatigue.  HEENT: Eyes: No visual loss, blurred vision, double vision or yellow sclerae.No hearing loss, sneezing, congestion, runny nose or sore throat.  SKIN: No rash or itching.  CARDIOVASCULAR: per hpi RESPIRATORY: No shortness of breath, cough or sputum.  GASTROINTESTINAL: No anorexia, nausea, vomiting or diarrhea. No abdominal pain or blood.  GENITOURINARY: No burning on urination, no polyuria NEUROLOGICAL: No headache, dizziness, syncope, paralysis, ataxia, numbness or tingling in the extremities. No change in bowel or bladder control.  MUSCULOSKELETAL: No muscle, back pain, joint pain or stiffness.  LYMPHATICS: No enlarged nodes. No history of splenectomy.  PSYCHIATRIC: No history of depression or anxiety.  ENDOCRINOLOGIC: No reports of sweating, cold or heat  intolerance. No polyuria or polydipsia.  Marland Kitchen   Physical Examination Vitals:   08/26/18 1256  BP: 112/68  Pulse: 90  SpO2: 97%   Vitals:   08/26/18 1256  Weight: 209 lb (94.8 kg)  Height: 5\' 10"  (1.778 m)    Gen: resting comfortably, no acute distress HEENT: no scleral icterus, pupils equal round and reactive, no palptable cervical adenopathy,  CV: RRR, no m/r/g, no jvd Resp: Clear to auscultation bilaterally GI: abdomen is soft, non-tender, non-distended, normal bowel sounds, no hepatosplenomegaly MSK: extremities are  warm, no edema.  Skin: warm, no rash Neuro:  no focal deficits Psych: appropriate affect   Diagnostic Studies  08/2015 echo  Study Conclusions  - Left ventricle: The cavity size was normal. There was mild focal basal hypertrophy of the septum. Systolic function was mildly reduced. The estimated ejection fraction was in the range of 45% to 50%. Diffuse hypokinesis. There was an increased relative contribution of atrial contraction to ventricular filling. Doppler parameters are consistent with abnormal left ventricular relaxation (grade 1 diastolic dysfunction).   08/2015 cath  The left ventricular systolic function is normal.  Prox RCA lesion, 20% stenosed.  Mid LAD lesion, 40% stenosed.  Ost Cx lesion, 40% stenosed.  Ost LAD lesion, 99% stenosed. Post intervention, there is a 0% residual stenosis.  1. Severe one-vessel coronary artery disease with 99% thrombotic stenosis in the proximal/ostial LAD. Otherwise mild disease affecting the RCA and LCX.  2. Low normal LV systolic function. Mildly elevated left ventricular end-diastolic pressure. 3. Successful angioplasty and drug-eluting stent placement to the LAD.  Post cath Recommendations: Dual antiplatelet therapy for at least one year. Cardiac rehabilitation and usual post MI care.   Assessment and Plan  1. CAD - doing well without symptoms, continue current meds - EKG today shows SR, no ischemic changes  2. HTN --at goal, cotinue current meds  3. Hyperlipidemia - LDL at goal, continue statin. Counseled on dietary changes to improve TGs  4. Preoperative evaluation - no active acute cardiac conditions - tolerates greater than 4METs regularly without limitation or symptoms - recommend proceeding with surgery as planned from cardiac standpoing - if neccesary may hold ASA 7 days prior, resume day after surgery   F/u 1 year    Arnoldo Lenis, M.D.

## 2018-08-26 NOTE — Patient Instructions (Signed)
Your physician wants you to follow-up in:  1 year with Dr.Branch You will receive a reminder letter in the mail two months in advance. If you don't receive a letter, please call our office to schedule the follow-up appointment.    Your physician recommends that you continue on your current medications as directed. Please refer to the Current Medication list given to you today.    If you need a refill on your cardiac medications before your next appointment, please call your pharmacy.     No labs or tests ordered today.      Thank you for choosing [facility] !

## 2018-10-23 ENCOUNTER — Ambulatory Visit: Payer: Managed Care, Other (non HMO) | Admitting: Nurse Practitioner

## 2018-10-23 ENCOUNTER — Encounter: Payer: Self-pay | Admitting: Nurse Practitioner

## 2018-10-23 ENCOUNTER — Other Ambulatory Visit: Payer: Self-pay

## 2018-10-23 DIAGNOSIS — Z0000 Encounter for general adult medical examination without abnormal findings: Secondary | ICD-10-CM | POA: Diagnosis not present

## 2018-10-23 DIAGNOSIS — Z1211 Encounter for screening for malignant neoplasm of colon: Secondary | ICD-10-CM

## 2018-10-23 MED ORDER — PEG 3350-KCL-NA BICARB-NACL 420 G PO SOLR: 4000.0000 mL | ORAL | 0 refills | Status: DC

## 2018-10-23 NOTE — Progress Notes (Signed)
Primary Care Physician:  Delsa Grana, PA-C Primary Gastroenterologist:  Dr. Gala Romney  Chief Complaint  Patient presents with  . Consult    tcs. Never had done prior. No FH of colon cancer    HPI:   Devin Gardner is a 62 y.o. male who presents on referral from primary care to schedule first ever colonoscopy.  Reviewed information pertinent to the referral including office visit by primary care dated 07/12/2018.  At that time the patient presented for an annual exam.  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  He admitted to drinking 5-6 beers a day.  Has not had any previous colon cancer screening by either Hemoccult or colonoscopy.  He was due for hepatitis C screening and HIV screening.  At his last visit he refused hepatitis C and HIV screening for several months.  Hesitant for colonoscopy.  Nurse triage was deferred to office visit due to alcohol consumption.  Today he states he's doing ok overall. Has never had a colonoscopy before. Denies abdominal pain, N/V, hematochezia, melena, fever, chills, unintentional weight loss. Denies chest pain, dyspnea, dizziness, lightheadedness, syncope, near syncope. Denies any other upper or lower GI symptoms.  He saw cardiology (Dr. Harl Bowie with [facility] in [place]) and was cleared for surgery.  Past Medical History:  Diagnosis Date  . CAD (coronary artery disease)    a. 09/19/15 ant STEMI s/p DES to ost LAD. Otherwise mild disease affecting the RCA and LCX.   Marland Kitchen HLD (hyperlipidemia)   . Left ventricular systolic dysfunction without heart failure    a. mild. 08/2015 2D ECHO with EF 45-50%, diffuse HK, G1DD  . Myocardial infarction [facility])     Past Surgical History:  Procedure Laterality Date  . CARDIAC CATHETERIZATION N/A 09/19/2015   Procedure: Left Heart Cath and Coronary Angiography;  Surgeon: Wellington Hampshire, MD;  Location: [facility];  Service: Cardiovascular;  Laterality: N/A;  . CARDIAC CATHETERIZATION N/A 09/19/2015   Procedure: Coronary Stent Intervention;  Surgeon: Wellington Hampshire, MD;  Location: [place] [facility];  Service: Cardiovascular;  Laterality: N/A;  . HERNIA REPAIR      Current Outpatient Medications  Medication Sig Dispense Refill  . aspirin 81 MG chewable tablet Chew 1 tablet (81 mg total) by mouth daily.    Marland Kitchen atorvastatin (LIPITOR) 40 MG tablet Take 1 tablet (40 mg total) by mouth daily. 90 tablet 3  . carvedilol (COREG) 3.125 MG tablet Take 1 tablet (3.125 mg total) by mouth 2 (two) times daily. NEED OV. 180 tablet 3  . lisinopril (PRINIVIL,ZESTRIL) 5 MG tablet TAKE 1 TABLET(5 MG) BY MOUTH DAILY 90 tablet 3   No current facility-administered medications for this visit.     Allergies as of 10/23/2018  . (No Known Allergies)    Family History  Problem Relation Age of Onset  . Stroke Father   . Heart attack Father 79       3 MI's died in 52's  . Colon cancer Neg Hx     Social History   Socioeconomic History  . Marital status: Married    Spouse name: Not on file  . Number of children: Not on file  . Years of education: Not on file  . Highest education level: Not on file  Occupational History  . Not on file  Social Needs  . Financial resource strain: Not on  file  . Food insecurity:    Worry: Not on file    Inability: Not on file  . Transportation needs:    Medical: Not on file    Non-medical: Not on file  Tobacco Use  . Smoking status: Never Smoker  . Smokeless tobacco: Never Used  Substance and Sexual Activity  . Alcohol use: Yes    Alcohol/week: 5.0 standard drinks    Types: 5 Cans of beer per week    Comment: 5-6 beers a day  . Drug use: No  . Sexual activity: Yes  Lifestyle  . Physical activity:    Days per week: 5 days    Minutes per session: 30 min  . Stress: Not on file  Relationships  . Social connections:    Talks on phone: Not on file     Gets together: Not on file    Attends religious service: Not on file    Active member of club or organization: Not on file    Attends meetings of clubs or organizations: Not on file    Relationship status: Not on file  . Intimate partner violence:    Fear of current or ex partner: Not on file    Emotionally abused: Not on file    Physically abused: Not on file    Forced sexual activity: Not on file  Other Topics Concern  . Not on file  Social History Narrative  . Not on file    Review of Systems: General: Negative for anorexia, weight loss, fever, chills, fatigue, weakness. ENT: Negative for hoarseness, difficulty swallowing. CV: Negative for chest pain, angina, palpitations, peripheral edema.  Respiratory: Negative for dyspnea at rest, cough, sputum, wheezing.  GI: See history of present illness. MS: Negative for joint pain, low back pain.  Derm: Negative for rash or itching.  Endo: Negative for unusual weight change.  Heme: Negative for bruising or bleeding. Allergy: Negative for rash or hives.    Physical Exam: BP (!) 150/83   Pulse 85   Temp 98 F (36.7 C)   Ht 5\' 11"  (1.803 m)   Wt 214 lb (97.1 kg)   BMI 29.85 kg/m  General:   Alert and oriented. Pleasant and cooperative. Well-nourished and well-developed.  Eyes:  Without icterus, sclera clear and conjunctiva pink.  Ears:  Normal auditory acuity. Cardiovascular:  S1, S2 present without murmurs appreciated. Normal pulses noted. Extremities without clubbing or edema. Respiratory:  Clear to auscultation bilaterally. No wheezes, rales, or rhonchi. No distress.  Gastrointestinal:  +BS, soft, non-tender and non-distended. No HSM noted. No guarding or rebound. No masses appreciated.  Rectal:  Deferred  Musculoskalatal:  Symmetrical without gross deformities. Normal posture. Skin:  Intact without significant lesions or rashes. Neurologic:  Alert and oriented x4;  grossly normal neurologically. Psych:  Alert and  cooperative. Normal mood and affect. Heme/Lymph/Immune: No excessive bruising noted.    10/23/2018 3:02 PM   Disclaimer: This note was dictated with voice recognition software. Similar sounding words can inadvertently be transcribed and may not be corrected upon review.

## 2018-10-23 NOTE — Patient Instructions (Signed)
1. We will schedule your procedure for you. 2. Further recommendations will be made after your procedure. 3. Return for follow-up based on post procedure recommendations, or if needed for stomach/colon problems. 4. Call us if you have any questions or concerns.  At [facility] we value your feedback. You may receive a survey about your visit today. Please share your experience as we strive to create trusting relationships with our patients to provide genuine, compassionate, quality care.  We appreciate your understanding and patience as we review any laboratory studies, imaging, and other diagnostic tests that are ordered as we care for you. Our office policy is 5 business days for review of these results, and any emergent or urgent results are addressed in a timely manner for your best interest. If you do not hear from our office in 1 week, please contact us.   We also encourage the use of MyChart, which contains your medical information for your review as well. If you are not enrolled in this feature, an access code is on this after visit summary for your convenience. Thank you for allowing [place] to be involved in your care.  It was great to see you today!  I hope you have a Happy Thanksgiving!!

## 2018-10-23 NOTE — H&P (View-Only) (Signed)
Primary Care Physician:  Delsa Grana, PA-C Primary Gastroenterologist:  Dr. Gala Romney  Chief Complaint  Patient presents with  . Consult    tcs. Never had done prior. No FH of colon cancer    HPI:   Devin Gardner is a 62 y.o. male who presents on referral from primary care to schedule first ever colonoscopy.  Reviewed information pertinent to the referral including office visit by primary care dated 07/12/2018.  At that time the patient presented for an annual exam.  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  He admitted to drinking 5-6 beers a day.  Has not had any previous colon cancer screening by either Hemoccult or colonoscopy.  He was due for hepatitis C screening and HIV screening.  At his last visit he refused hepatitis C and HIV screening for several months.  Hesitant for colonoscopy.  Nurse triage was deferred to office visit due to alcohol consumption.  Today he states he's doing ok overall. Has never had a colonoscopy before. Denies abdominal pain, N/V, hematochezia, melena, fever, chills, unintentional weight loss. Denies chest pain, dyspnea, dizziness, lightheadedness, syncope, near syncope. Denies any other upper or lower GI symptoms.  He saw cardiology (Dr. Harl Bowie with [facility] in [place]) and was cleared for surgery.  Past Medical History:  Diagnosis Date  . CAD (coronary artery disease)    a. 09/19/15 ant STEMI s/p DES to ost LAD. Otherwise mild disease affecting the RCA and LCX.   Marland Kitchen HLD (hyperlipidemia)   . Left ventricular systolic dysfunction without heart failure    a. mild. 08/2015 2D ECHO with EF 45-50%, diffuse HK, G1DD  . Myocardial infarction [facility])     Past Surgical History:  Procedure Laterality Date  . CARDIAC CATHETERIZATION N/A 09/19/2015   Procedure: Left Heart Cath and Coronary Angiography;  Surgeon: Wellington Hampshire, MD;  Location: [place] [facility];  Service: Cardiovascular;  Laterality: N/A;  . CARDIAC CATHETERIZATION N/A 09/19/2015   Procedure: Coronary Stent Intervention;  Surgeon: Wellington Hampshire, MD;  Location: [place] [facility];  Service: Cardiovascular;  Laterality: N/A;  . HERNIA REPAIR      Current Outpatient Medications  Medication Sig Dispense Refill  . aspirin 81 MG chewable tablet Chew 1 tablet (81 mg total) by mouth daily.    Marland Kitchen atorvastatin (LIPITOR) 40 MG tablet Take 1 tablet (40 mg total) by mouth daily. 90 tablet 3  . carvedilol (COREG) 3.125 MG tablet Take 1 tablet (3.125 mg total) by mouth 2 (two) times daily. NEED OV. 180 tablet 3  . lisinopril (PRINIVIL,ZESTRIL) 5 MG tablet TAKE 1 TABLET(5 MG) BY MOUTH DAILY 90 tablet 3   No current facility-administered medications for this visit.     Allergies as of 10/23/2018  . (No Known Allergies)    Family History  Problem Relation Age of Onset  . Stroke Father   . Heart attack Father 79       3 MI's died in 63's  . Colon cancer Neg Hx     Social History   Socioeconomic History  . Marital status: Married    Spouse name: Not on file  . Number of children: Not on file  . Years of education: Not on file  . Highest education level: Not on file  Occupational History  . Not on file  Social Needs  . Financial resource strain: Not on  file  . Food insecurity:    Worry: Not on file    Inability: Not on file  . Transportation needs:    Medical: Not on file    Non-medical: Not on file  Tobacco Use  . Smoking status: Never Smoker  . Smokeless tobacco: Never Used  Substance and Sexual Activity  . Alcohol use: Yes    Alcohol/week: 5.0 standard drinks    Types: 5 Cans of beer per week    Comment: 5-6 beers a day  . Drug use: No  . Sexual activity: Yes  Lifestyle  . Physical activity:    Days per week: 5 days    Minutes per session: 30 min  . Stress: Not on file  Relationships  . Social connections:    Talks on phone: Not on file     Gets together: Not on file    Attends religious service: Not on file    Active member of club or organization: Not on file    Attends meetings of clubs or organizations: Not on file    Relationship status: Not on file  . Intimate partner violence:    Fear of current or ex partner: Not on file    Emotionally abused: Not on file    Physically abused: Not on file    Forced sexual activity: Not on file  Other Topics Concern  . Not on file  Social History Narrative  . Not on file    Review of Systems: General: Negative for anorexia, weight loss, fever, chills, fatigue, weakness. ENT: Negative for hoarseness, difficulty swallowing. CV: Negative for chest pain, angina, palpitations, peripheral edema.  Respiratory: Negative for dyspnea at rest, cough, sputum, wheezing.  GI: See history of present illness. MS: Negative for joint pain, low back pain.  Derm: Negative for rash or itching.  Endo: Negative for unusual weight change.  Heme: Negative for bruising or bleeding. Allergy: Negative for rash or hives.    Physical Exam: BP (!) 150/83   Pulse 85   Temp 98 F (36.7 C)   Ht 5\' 11"  (1.803 m)   Wt 214 lb (97.1 kg)   BMI 29.85 kg/m  General:   Alert and oriented. Pleasant and cooperative. Well-nourished and well-developed.  Eyes:  Without icterus, sclera clear and conjunctiva pink.  Ears:  Normal auditory acuity. Cardiovascular:  S1, S2 present without murmurs appreciated. Normal pulses noted. Extremities without clubbing or edema. Respiratory:  Clear to auscultation bilaterally. No wheezes, rales, or rhonchi. No distress.  Gastrointestinal:  +BS, soft, non-tender and non-distended. No HSM noted. No guarding or rebound. No masses appreciated.  Rectal:  Deferred  Musculoskalatal:  Symmetrical without gross deformities. Normal posture. Skin:  Intact without significant lesions or rashes. Neurologic:  Alert and oriented x4;  grossly normal neurologically. Psych:  Alert and  cooperative. Normal mood and affect. Heme/Lymph/Immune: No excessive bruising noted.    10/23/2018 3:02 PM   Disclaimer: This note was dictated with voice recognition software. Similar sounding words can inadvertently be transcribed and may not be corrected upon review.

## 2018-10-23 NOTE — Assessment & Plan Note (Signed)
The patient was seen in the office to schedule first ever colonoscopy.  Office visit due to chronic alcohol intake likely necessitating augmented sedation.  The patient is generally asymptomatic from a GI standpoint.  He has never had a colonoscopy before and is age 62.  At this point we will proceed with first ever screening colonoscopy.  We will plan for the procedure on propofol/MAC due to his alcohol intake of 5-6 drinks a day.  Follow-up based on post procedure recommendations.  Proceed with TCS on propofol/MAC with Dr. Gala Romney in near future: the risks, benefits, and alternatives have been discussed with the patient in detail. The patient states understanding and desires to proceed.  The patient is not on any anticoagulants, anxiolytics, chronic pain medications, or antidepressants.  He does drink 5-6 beers a day.  No recreational drug use.  We will plan for the procedure on propofol/MAC to promote adequate sedation.

## 2018-10-28 ENCOUNTER — Telehealth: Payer: Self-pay

## 2018-10-28 NOTE — Progress Notes (Signed)
cc'ed to pcp °

## 2018-10-28 NOTE — Telephone Encounter (Signed)
Called and informed pt of pre-op appt 11/04/18 at 2:15pm. Letter mailed.

## 2018-10-29 ENCOUNTER — Telehealth: Payer: Self-pay | Admitting: Internal Medicine

## 2018-10-29 NOTE — Telephone Encounter (Signed)
LMOVM for endo scheduler re: labs. Lab results are not in Epic.

## 2018-10-29 NOTE — Telephone Encounter (Signed)
Endo scheduler called office and LMOVM. Pre-op nurse advised pt will have a pre-op phone call 11/04/18. Called and informed pt.

## 2018-10-29 NOTE — Telephone Encounter (Signed)
Spoke with pt and he had his blood work done 2 weeks ago when he had a surgical procedure. He would like to know if that blood work can be used instead of being stuck twice. His TCS is 11/11/18 with RMR.

## 2018-10-29 NOTE — Telephone Encounter (Signed)
Pt called to verify his pre op date and time. He has questions about having his labs drawn again and wanted to know if we could use the labs that he had done "2 Mondays ago". Please advise and call him at [phone]

## 2018-10-30 NOTE — Telephone Encounter (Signed)
Noted  

## 2018-11-01 ENCOUNTER — Encounter (HOSPITAL_COMMUNITY): Payer: Self-pay

## 2018-11-04 ENCOUNTER — Encounter (HOSPITAL_COMMUNITY)
Admission: RE | Admit: 2018-11-04 | Discharge: 2018-11-04 | Disposition: A | Discharge status: Home / Self Care | Payer: Managed Care, Other (non HMO) | Source: Ambulatory Visit | Attending: Internal Medicine | Admitting: Internal Medicine

## 2018-11-05 ENCOUNTER — Other Ambulatory Visit (HOSPITAL_COMMUNITY): Payer: Managed Care, Other (non HMO)

## 2018-11-11 ENCOUNTER — Ambulatory Visit (HOSPITAL_COMMUNITY): Payer: Managed Care, Other (non HMO) | Admitting: Anesthesiology

## 2018-11-11 ENCOUNTER — Ambulatory Visit (HOSPITAL_COMMUNITY)
Admission: RE | Admit: 2018-11-11 | Discharge: 2018-11-11 | Disposition: A | Discharge status: Home / Self Care | Payer: Managed Care, Other (non HMO) | Attending: Internal Medicine | Admitting: Internal Medicine

## 2018-11-11 ENCOUNTER — Encounter (HOSPITAL_COMMUNITY)
Admission: RE | Disposition: A | Discharge status: Home / Self Care | Payer: Self-pay | Source: Home / Self Care | Attending: Internal Medicine

## 2018-11-11 ENCOUNTER — Other Ambulatory Visit: Payer: Self-pay

## 2018-11-11 ENCOUNTER — Encounter (HOSPITAL_COMMUNITY): Payer: Self-pay | Admitting: Anesthesiology

## 2018-11-11 DIAGNOSIS — I10 Essential (primary) hypertension: Secondary | ICD-10-CM | POA: Insufficient documentation

## 2018-11-11 DIAGNOSIS — K5730 Diverticulosis of large intestine without perforation or abscess without bleeding: Secondary | ICD-10-CM | POA: Diagnosis not present

## 2018-11-11 DIAGNOSIS — D122 Benign neoplasm of ascending colon: Secondary | ICD-10-CM | POA: Diagnosis not present

## 2018-11-11 DIAGNOSIS — Z1211 Encounter for screening for malignant neoplasm of colon: Secondary | ICD-10-CM | POA: Diagnosis present

## 2018-11-11 DIAGNOSIS — E785 Hyperlipidemia, unspecified: Secondary | ICD-10-CM | POA: Diagnosis not present

## 2018-11-11 DIAGNOSIS — Z79899 Other long term (current) drug therapy: Secondary | ICD-10-CM | POA: Insufficient documentation

## 2018-11-11 DIAGNOSIS — D123 Benign neoplasm of transverse colon: Secondary | ICD-10-CM | POA: Diagnosis not present

## 2018-11-11 DIAGNOSIS — Z7982 Long term (current) use of aspirin: Secondary | ICD-10-CM | POA: Insufficient documentation

## 2018-11-11 DIAGNOSIS — Z955 Presence of coronary angioplasty implant and graft: Secondary | ICD-10-CM | POA: Diagnosis not present

## 2018-11-11 DIAGNOSIS — I252 Old myocardial infarction: Secondary | ICD-10-CM | POA: Diagnosis not present

## 2018-11-11 DIAGNOSIS — I2510 Atherosclerotic heart disease of native coronary artery without angina pectoris: Secondary | ICD-10-CM | POA: Insufficient documentation

## 2018-11-11 HISTORY — PX: COLONOSCOPY WITH PROPOFOL: SHX5780

## 2018-11-11 HISTORY — PX: POLYPECTOMY: SHX5525

## 2018-11-11 SURGERY — COLONOSCOPY WITH PROPOFOL
Anesthesia: Monitor Anesthesia Care

## 2018-11-11 MED ORDER — CHLORHEXIDINE GLUCONATE CLOTH 2 % EX PADS: 6.0000 | MEDICATED_PAD | Freq: Once | CUTANEOUS | Status: DC

## 2018-11-11 MED ORDER — STERILE WATER FOR IRRIGATION IR SOLN
Status: DC | PRN
  Administered 2018-11-11: 1.5 mL

## 2018-11-11 MED ORDER — PROPOFOL 500 MG/50ML IV EMUL
INTRAVENOUS | Status: DC | PRN
  Administered 2018-11-11: 13:00:00 via INTRAVENOUS
  Administered 2018-11-11: 150 ug/kg/min via INTRAVENOUS

## 2018-11-11 MED ORDER — LACTATED RINGERS IV SOLN
INTRAVENOUS | Status: DC
  Administered 2018-11-11: 12:00:00 via INTRAVENOUS

## 2018-11-11 MED ORDER — PROPOFOL 10 MG/ML IV BOLUS
INTRAVENOUS | Status: DC | PRN
  Administered 2018-11-11: 30 mg via INTRAVENOUS
  Administered 2018-11-11: 15 mg via INTRAVENOUS

## 2018-11-11 MED ORDER — MIDAZOLAM HCL 5 MG/5ML IJ SOLN
INTRAMUSCULAR | Status: DC | PRN
  Administered 2018-11-11: 2 mg via INTRAVENOUS

## 2018-11-11 MED ORDER — KETAMINE HCL 10 MG/ML IJ SOLN
INTRAMUSCULAR | Status: DC | PRN
  Administered 2018-11-11 (×2): 10 mg via INTRAVENOUS

## 2018-11-11 MED ORDER — KETAMINE HCL 50 MG/5ML IJ SOSY
PREFILLED_SYRINGE | INTRAMUSCULAR | Status: AC
  Filled 2018-11-11: qty 5

## 2018-11-11 MED ORDER — MIDAZOLAM HCL 2 MG/2ML IJ SOLN
INTRAMUSCULAR | Status: AC
  Filled 2018-11-11: qty 2

## 2018-11-11 NOTE — Transfer of Care (Signed)
Immediate Anesthesia Transfer of Care Note  Patient: ONIX JUMPER  Procedure(s) Performed: COLONOSCOPY WITH PROPOFOL (N/A ) POLYPECTOMY  Patient Location: PACU  Anesthesia Type:General  Level of Consciousness: awake and patient cooperative  Airway & Oxygen Therapy: Patient Spontanous Breathing  Post-op Assessment: Report given to RN, Post -op Vital signs reviewed and stable and Patient moving all extremities  Post vital signs: Reviewed and stable  Last Vitals:  Vitals Value Taken Time  BP    Temp    Pulse 74 11/11/2018  1:45 PM  Resp    SpO2 91 % 11/11/2018  1:45 PM  Vitals shown include unvalidated device data.  Last Pain:  Vitals:   11/11/18 1134  TempSrc: Oral  PainSc:       Patients Stated Pain Goal: 8 (46/21/94 7125)  Complications: No apparent anesthesia complications

## 2018-11-11 NOTE — Interval H&P Note (Signed)
History and Physical Interval Note:  11/11/2018 1:10 PM  Devin Gardner  has presented today for surgery, with the diagnosis of screening colonoscopy  The various methods of treatment have been discussed with the patient and family. After consideration of risks, benefits and other options for treatment, the patient has consented to  Procedure(s) with comments: COLONOSCOPY WITH PROPOFOL (N/A) - 1:45pm as a surgical intervention .  The patient's history has been reviewed, patient examined, no change in status, stable for surgery.  I have reviewed the patient's chart and labs.  Questions were answered to the patient's satisfaction.     [name]  No change.  Patient is here for first ever screening colonoscopy.  The risks, benefits, limitations, alternatives and imponderables have been reviewed with the patient. Questions have been answered. All parties are agreeable.

## 2018-11-11 NOTE — Discharge Instructions (Signed)
Colonoscopy Discharge Instructions  Read the instructions outlined below and refer to this sheet in the next few weeks. These discharge instructions provide you with general information on caring for yourself after you leave the hospital. Your doctor may also give you specific instructions. While your treatment has been planned according to the most current medical practices available, unavoidable complications occasionally occur. If you have any problems or questions after discharge, call Dr. Gala Romney at [phone]. ACTIVITY  You may resume your regular activity, but move at a slower pace for the next 24 hours.   Take frequent rest periods for the next 24 hours.   Walking will help get rid of the air and reduce the bloated feeling in your belly (abdomen).   No driving for 24 hours (because of the medicine (anesthesia) used during the test).    Do not sign any important legal documents or operate any machinery for 24 hours (because of the anesthesia used during the test).  NUTRITION  Drink plenty of fluids.   You may resume your normal diet as instructed by your doctor.   Begin with a light meal and progress to your normal diet. Heavy or fried foods are harder to digest and may make you feel sick to your stomach (nauseated).   Avoid alcoholic beverages for 24 hours or as instructed.  MEDICATIONS  You may resume your normal medications unless your doctor tells you otherwise.  WHAT YOU CAN EXPECT TODAY  Some feelings of bloating in the abdomen.   Passage of more gas than usual.   Spotting of blood in your stool or on the toilet paper.  IF YOU HAD POLYPS REMOVED DURING THE COLONOSCOPY:  No aspirin products for 7 days or as instructed.   No alcohol for 7 days or as instructed.   Eat a soft diet for the next 24 hours.  FINDING OUT THE RESULTS OF YOUR TEST Not all test results are available during your visit. If your test results are not back during the visit, make an appointment  with your caregiver to find out the results. Do not assume everything is normal if you have not heard from your caregiver or the medical facility. It is important for you to follow up on all of your test results.  SEEK IMMEDIATE MEDICAL ATTENTION IF:  You have more than a spotting of blood in your stool.   Your belly is swollen (abdominal distention).   You are nauseated or vomiting.   You have a temperature over 101.   You have abdominal pain or discomfort that is severe or gets worse throughout the day.   Colon Polyps Polyps are tissue growths inside the body. Polyps can grow in many places, including the large intestine (colon). A polyp may be a round bump or a mushroom-shaped growth. You could have one polyp or several. Most colon polyps are noncancerous (benign). However, some colon polyps can become cancerous over time. What are the causes? The exact cause of colon polyps is not known. What increases the risk? This condition is more likely to develop in people who:  Have a family history of colon cancer or colon polyps.  Are older than 29 or older than 45 if they are African American.  Have inflammatory bowel disease, such as ulcerative colitis or Crohn disease.  Are overweight.  Smoke cigarettes.  Do not get enough exercise.  Drink too much alcohol.  Eat a diet that is: ? High in fat and red meat. ? Low in fiber.  Had childhood cancer that was treated with abdominal radiation. ° °What are the signs or symptoms? °Most polyps do not cause symptoms. If you have symptoms, they may include: °· Blood coming from your rectum when having a bowel movement. °· Blood in your stool. The stool may look dark red or black. °· A change in bowel habits, such as constipation or diarrhea. ° °How is this diagnosed? °This condition is diagnosed with a colonoscopy. This is a procedure that uses a lighted, flexible scope to look at the inside of your colon. °How is this treated? °Treatment for  this condition involves removing any polyps that are found. Those polyps will then be tested for cancer. If cancer is found, your health care provider will talk to you about options for colon cancer treatment. °Follow these instructions at home: °Diet °· Eat plenty of fiber, such as fruits, vegetables, and whole grains. °· Eat foods that are high in calcium and vitamin D, such as milk, cheese, yogurt, eggs, liver, fish, and broccoli. °· Limit foods high in fat, red meats, and processed meats, such as hot dogs, sausage, bacon, and lunch meats. °· Maintain a healthy weight, or lose weight if recommended by your health care provider. °General instructions °· Do not smoke cigarettes. °· Do not drink alcohol excessively. °· Keep all follow-up visits as told by your health care provider. This is important. This includes keeping regularly scheduled colonoscopies. Talk to your health care provider about when you need a colonoscopy. °· Exercise every day or as told by your health care provider. °Contact a health care provider if: °· You have new or worsening bleeding during a bowel movement. °· You have new or increased blood in your stool. °· You have a change in bowel habits. °· You unexpectedly lose weight. °This information is not intended to replace advice given to you by your health care provider. Make sure you discuss any questions you have with your health care provider. °Document Released: 08/09/2004 Document Revised: 04/20/2016 Document Reviewed: 10/04/2015 °Elsevier Interactive Patient Education © 2018 [employer] ° °Diverticulosis °Diverticulosis is a condition that develops when small pouches (diverticula) form in the wall of the large intestine (colon). The colon is where water is absorbed and stool is formed. The pouches form when the inside layer of the colon pushes through weak spots in the outer layers of the colon. You may have a few pouches or many of them. °What are the causes? °The cause of this  condition is not known. °What increases the risk? °The following factors may make you more likely to develop this condition: °· Being older than age 60. Your risk for this condition increases with age. Diverticulosis is rare among people younger than age 30. By age 80, many people have it. °· Eating a low-fiber diet. °· Having frequent constipation. °· Being overweight. °· Not getting enough exercise. °· Smoking. °· Taking over-the-counter pain medicines, like aspirin and ibuprofen. °· Having a family history of diverticulosis. ° °What are the signs or symptoms? °In most people, there are no symptoms of this condition. If you do have symptoms, they may include: °· Bloating. °· Cramps in the abdomen. °· Constipation or diarrhea. °· Pain in the lower left side of the abdomen. ° °How is this diagnosed? °This condition is most often diagnosed during an exam for other colon problems. Because diverticulosis usually has no symptoms, it often cannot be diagnosed independently. This condition may be diagnosed by: °· Using a flexible scope to examine the   colon (colonoscopy).  Taking an X-ray of the colon after dye has been put into the colon (barium enema).  Doing a CT scan.  How is this treated? You may not need treatment for this condition if you have never developed an infection related to diverticulosis. If you have had an infection before, treatment may include:  Eating a high-fiber diet. This may include eating more fruits, vegetables, and grains.  Taking a fiber supplement.  Taking a live bacteria supplement (probiotic).  Taking medicine to relax your colon.  Taking antibiotic medicines.  Follow these instructions at home:  Drink 6-8 glasses of water or more each day to prevent constipation.  Try not to strain when you have a bowel movement.  If you have had an infection before: ? Eat more fiber as directed by your health care provider or your diet and nutrition specialist (dietitian). ? Take  a fiber supplement or probiotic, if your health care provider approves.  Take over-the-counter and prescription medicines only as told by your health care provider.  If you were prescribed an antibiotic, take it as told by your health care provider. Do not stop taking the antibiotic even if you start to feel better.  Keep all follow-up visits as told by your health care provider. This is important. Contact a health care provider if:  You have pain in your abdomen.  You have bloating.  You have cramps.  You have not had a bowel movement in 3 days. Get help right away if:  Your pain gets worse.  Your bloating becomes very bad.  You have a fever or chills, and your symptoms suddenly get worse.  You vomit.  You have bowel movements that are bloody or black.  You have bleeding from your rectum. Summary  Diverticulosis is a condition that develops when small pouches (diverticula) form in the wall of the large intestine (colon).  You may have a few pouches or many of them.  This condition is most often diagnosed during an exam for other colon problems.  If you have had an infection related to diverticulosis, treatment may include increasing the fiber in your diet, taking supplements, or taking medicines. This information is not intended to replace advice given to you by your health care provider. Make sure you discuss any questions you have with your health care provider. Document Released: 08/10/2004 Document Revised: 10/02/2016 Document Reviewed: 10/02/2016 Elsevier Interactive Patient Education  2017 [employer]   Colon polyp and diverticulosis information provided  Further recommendations to follow pending review of pathology report

## 2018-11-11 NOTE — Anesthesia Postprocedure Evaluation (Signed)
Anesthesia Post Note  Patient: Devin Gardner  Procedure(s) Performed: COLONOSCOPY WITH PROPOFOL (N/A ) POLYPECTOMY  Patient location during evaluation: PACU Anesthesia Type: General Level of consciousness: awake and patient cooperative Pain management: pain level controlled Vital Signs Assessment: post-procedure vital signs reviewed and stable Respiratory status: spontaneous breathing, nonlabored ventilation and respiratory function stable Cardiovascular status: blood pressure returned to baseline Postop Assessment: no apparent nausea or vomiting Anesthetic complications: no     Last Vitals:  Vitals:   11/11/18 1134  BP: (!) 149/85  Pulse: 74  Resp: 18  Temp: 36.9 C  SpO2: 98%    Last Pain:  Vitals:   11/11/18 1134  TempSrc: Oral  PainSc:                  Devin Gardner

## 2018-11-11 NOTE — Anesthesia Preprocedure Evaluation (Signed)
Anesthesia Evaluation  Patient identified by MRN, date of birth, ID band Patient awake    Reviewed: Allergy & Precautions, H&P , NPO status , Patient's Chart, lab work & pertinent test results, reviewed documented beta blocker date and time   Airway Mallampati: II  TM Distance: >3 FB Neck ROM: full    Dental no notable dental hx.    Pulmonary neg pulmonary ROS,    Pulmonary exam normal breath sounds clear to auscultation       Cardiovascular Exercise Tolerance: Good hypertension, + CAD and + Past MI   Rhythm:regular Rate:Normal     Neuro/Psych negative neurological ROS  negative psych ROS   GI/Hepatic negative GI ROS, Neg liver ROS,   Endo/Other  negative endocrine ROS  Renal/GU negative Renal ROS  negative genitourinary   Musculoskeletal   Abdominal   Peds  Hematology negative hematology ROS (+)   Anesthesia Other Findings   Reproductive/Obstetrics negative OB ROS                             Anesthesia Physical Anesthesia Plan  ASA: III  Anesthesia Plan: MAC   Post-op Pain Management:    Induction:   PONV Risk Score and Plan:   Airway Management Planned:   Additional Equipment:   Intra-op Plan:   Post-operative Plan:   Informed Consent: I have reviewed the patients History and Physical, chart, labs and discussed the procedure including the risks, benefits and alternatives for the proposed anesthesia with the patient or authorized representative who has indicated his/her understanding and acceptance.     Plan Discussed with: CRNA  Anesthesia Plan Comments:         Anesthesia Quick Evaluation

## 2018-11-11 NOTE — Op Note (Signed)
[facility] Patient Name: Devin Gardner Procedure Date: 11/11/2018 1:09 PM MRN: [id number] Date of Birth: Nov 07, 1956 Attending MD: Norvel Richards , MD CSN: [id number] Age: 62 Admit Type: Outpatient Procedure:                Colonoscopy Indications:              Screening for colorectal malignant neoplasm Providers:                Norvel Richards, MD, Lurline Del, RN, Gerome Sam, RN, Randa Spike, Technician Referring MD:              Medicines:                Propofol per Anesthesia Complications:            No immediate complications. Estimated Blood Loss:     Estimated blood loss was minimal. Estimated blood                            loss: none. Procedure:                Pre-Anesthesia Assessment:                           - Prior to the procedure, a History and Physical                            was performed, and patient medications and                            allergies were reviewed. The patient's tolerance of                            previous anesthesia was also reviewed. The risks                            and benefits of the procedure and the sedation                            options and risks were discussed with the patient.                            All questions were answered, and informed consent                            was obtained. Prior Anticoagulants: The patient has                            taken no previous anticoagulant or antiplatelet                            agents. ASA Grade Assessment: II - A patient with  mild systemic disease. After reviewing the risks                            and benefits, the patient was deemed in                            satisfactory condition to undergo the procedure.                           After obtaining informed consent, the colonoscope                            was passed under direct vision. Throughout the   procedure, the patient's blood pressure, pulse, and                            oxygen saturations were monitored continuously. The                            [id number]-[id number] ([id number]) scope was introduced through                            the anus and advanced to the the cecum, identified                            by appendiceal orifice and ileocecal valve. The                            colonoscopy was performed without difficulty. The                            patient tolerated the procedure well. The quality                            of the bowel preparation was adequate. The                            ileocecal valve, appendiceal orifice, and rectum                            were photographed. The entire colon was well                            visualized. Scope In: 1:25:16 PM Scope Out: 1:40:33 PM Scope Withdrawal Time: 0 hours 11 minutes 30 seconds  Total Procedure Duration: 0 hours 15 minutes 17 seconds  Findings:      The perianal and digital rectal examinations were normal.      Multiple small and large-mouthed diverticula were found in the sigmoid       colon and descending colon.      Three sessile polyps were found in the splenic flexure and ascending       colon. The polyps were 5 to 6 mm in size. These polyps were removed with       a cold snare. Resection and retrieval were complete. Estimated  blood       loss was minimal.      The exam was otherwise without abnormality on direct and retroflexion       views. Impression:               - Diverticulosis in the sigmoid colon and in the                            descending colon.                           - Three 5 to 6 mm polyps at the splenic flexure and                            in the ascending colon, removed with a cold snare.                            Resected and retrieved.                           - The examination was otherwise normal on direct                            and retroflexion views. Moderate  Sedation:      Moderate (conscious) sedation was personally administered by an       anesthesia professional. The following parameters were monitored: oxygen       saturation, heart rate, blood pressure, respiratory rate, EKG, adequacy       of pulmonary ventilation, and response to care. Recommendation:           - Patient has a contact number available for                            emergencies. The signs and symptoms of potential                            delayed complications were discussed with the                            patient. Return to normal activities tomorrow.                            Written discharge instructions were provided to the                            patient.                           - Advance diet as tolerated.                           - Repeat colonoscopy date to be determined after                            pending pathology results are reviewed for  surveillance based on pathology results.                           - Return to GI office (date not yet determined). Procedure Code(s):        --- Professional ---                           [phone], Colonoscopy, flexible; with removal of                            tumor(s), polyp(s), or other lesion(s) by snare                            technique Diagnosis Code(s):        --- Professional ---                           Z12.11, Encounter for screening for malignant                            neoplasm of colon                           D12.3, Benign neoplasm of transverse colon (hepatic                            flexure or splenic flexure)                           D12.2, Benign neoplasm of ascending colon                           K57.30, Diverticulosis of large intestine without                            perforation or abscess without bleeding CPT copyright 2018 [employer]. All rights reserved. The codes documented in this report are preliminary and upon coder  review may  be revised to meet current compliance requirements. Cristopher Estimable. Kirstie Larsen, MD Norvel Richards, MD 11/11/2018 1:48:36 PM This report has been signed electronically. Number of Addenda: 0

## 2018-11-12 ENCOUNTER — Ambulatory Visit: Payer: Managed Care, Other (non HMO) | Admitting: Family Medicine

## 2018-11-14 ENCOUNTER — Encounter (HOSPITAL_COMMUNITY): Payer: Self-pay | Admitting: Internal Medicine

## 2018-11-17 ENCOUNTER — Encounter: Payer: Self-pay | Admitting: Internal Medicine

## 2019-09-16 ENCOUNTER — Telehealth: Payer: Self-pay | Admitting: Cardiology

## 2019-09-16 NOTE — Telephone Encounter (Signed)

## 2019-09-17 ENCOUNTER — Other Ambulatory Visit: Payer: Self-pay

## 2019-09-17 ENCOUNTER — Telehealth (INDEPENDENT_AMBULATORY_CARE_PROVIDER_SITE_OTHER): Payer: Managed Care, Other (non HMO) | Admitting: Cardiology

## 2019-09-17 ENCOUNTER — Encounter: Payer: Self-pay | Admitting: Cardiology

## 2019-09-17 VITALS — Ht 71.0 in | Wt 210.0 lb

## 2019-09-17 DIAGNOSIS — I2510 Atherosclerotic heart disease of native coronary artery without angina pectoris: Secondary | ICD-10-CM | POA: Diagnosis not present

## 2019-09-17 DIAGNOSIS — E782 Mixed hyperlipidemia: Secondary | ICD-10-CM

## 2019-09-17 DIAGNOSIS — I10 Essential (primary) hypertension: Secondary | ICD-10-CM

## 2019-09-17 MED ORDER — ATORVASTATIN CALCIUM 40 MG PO TABS: 40.0000 mg | ORAL_TABLET | Freq: Every day | ORAL | 3 refills | Status: DC

## 2019-09-17 MED ORDER — CARVEDILOL 3.125 MG PO TABS: 3.1250 mg | ORAL_TABLET | Freq: Two times a day (BID) | ORAL | 3 refills | Status: DC

## 2019-09-17 MED ORDER — LISINOPRIL 5 MG PO TABS: ORAL_TABLET | ORAL | 3 refills | Status: DC

## 2019-09-17 NOTE — Progress Notes (Signed)
Virtual Visit via Telephone Note   This visit type was conducted due to national recommendations for restrictions regarding the COVID-19 Pandemic (e.g. social distancing) in an effort to limit this patient's exposure and mitigate transmission in our community.  Due to his co-morbid illnesses, this patient is at least at moderate risk for complications without adequate follow up.  This format is felt to be most appropriate for this patient at this time.  The patient did not have access to video technology/had technical difficulties with video requiring transitioning to audio format only (telephone).  All issues noted in this document were discussed and addressed.  No physical exam could be performed with this format.  Please refer to the patient's chart for his  consent to telehealth for [facility]   Date:  09/17/2019   ID:  Devin Gardner, DOB May 13, 1956, MRN [id number]  Patient Location: Home Provider Location: Office  PCP:  Delsa Grana, PA-C  Cardiologist:  Dr Harl Bowie Electrophysiologist:  None   Evaluation Performed:  Follow-Up Visit  Chief Complaint:  Follow up visit  History of Present Illness:    Devin Gardner is a 63 y.o. male seen today for follow up of the following medica problems.   1. CAD - anterior STEMI 08/2015, received DES to LAD.  - 08/2015 echo LVEF [id number], grade I diastolic dysfunction  - denies any chest pain - no recent SOB/DOE - compliant with meds  2. HTN - compliant with meds  3. Hyperlipidemia - 06/2018 TC 156 TG 274 HDL 63 LDL 38 - labs followed by pcp      SH: works for [employer]. Bought a beach home at [facility], spent several months down there since he was working from home due to [employer] trip to [place] with girlfriend.   The patient does not have symptoms concerning for COVID-19 infection (fever, chills, cough, or new shortness of breath).    Past Medical History:  Diagnosis Date  . CAD  (coronary artery disease)    a. 09/19/15 ant STEMI s/p DES to ost LAD. Otherwise mild disease affecting the RCA and LCX.   Marland Kitchen HLD (hyperlipidemia)   . Left ventricular systolic dysfunction without heart failure    a. mild. 08/2015 2D ECHO with EF 45-50%, diffuse HK, G1DD  . Myocardial infarction [facility])    Past Surgical History:  Procedure Laterality Date  . CARDIAC CATHETERIZATION N/A 09/19/2015   Procedure: Left Heart Cath and Coronary Angiography;  Surgeon: Wellington Hampshire, MD;  Location: [place] [facility];  Service: Cardiovascular;  Laterality: N/A;  . CARDIAC CATHETERIZATION N/A 09/19/2015   Procedure: Coronary Stent Intervention;  Surgeon: Wellington Hampshire, MD;  Location: [place] [facility];  Service: Cardiovascular;  Laterality: N/A;  . COLONOSCOPY WITH PROPOFOL N/A 11/11/2018   Procedure: COLONOSCOPY WITH PROPOFOL;  Surgeon: Daneil Dolin, MD;  [facility];  Service: Endoscopy;  Laterality: N/A;  1:45pm  . HERNIA REPAIR    . POLYPECTOMY  11/11/2018   Procedure: POLYPECTOMY;  Surgeon: Daneil Dolin, MD;  [facility];  Service: Endoscopy;;  (colon)     Current Meds  Medication Sig  . aspirin 81 MG chewable tablet Chew 1 tablet (81 mg total) by mouth daily.  Marland Kitchen atorvastatin (LIPITOR) 40 MG tablet Take 1 tablet (40 mg total) by mouth daily.  . carvedilol (COREG) 3.125 MG tablet Take 1 tablet (3.125 mg total) by mouth 2 (two) times daily. NEED OV.  Marland Kitchen lisinopril (  PRINIVIL,ZESTRIL) 5 MG tablet TAKE 1 TABLET(5 MG) BY MOUTH DAILY (Patient taking differently: Take 5 mg by mouth daily. )     Allergies:   Patient has no known allergies.   Social History   Tobacco Use  . Smoking status: Never Smoker  . Smokeless tobacco: Never Used  Substance Use Topics  . Alcohol use: Yes    Alcohol/week: 5.0 standard drinks    Types: 5 Cans of beer per week    Comment: 5-6 beers a day  . Drug use: No     Family Hx: The patient's family history includes Heart attack  (age of onset: 4) in his father; Stroke in his father. There is no history of Colon cancer.  ROS:   Please see the history of present illness.     All other systems reviewed and are negative.   Prior CV studies:   The following studies were reviewed today:  08/2015 echo Study Conclusions  - Left ventricle: The cavity size was normal. There was mild focal basal hypertrophy of the septum. Systolic function was mildly reduced. The estimated ejection fraction was in the range of 45% to 50%. Diffuse hypokinesis. There was an increased relative contribution of atrial contraction to ventricular filling. Doppler parameters are consistent with abnormal left ventricular relaxation (grade 1 diastolic dysfunction).  08/2015 cath  The left ventricular systolic function is normal.  Prox RCA lesion, 20% stenosed.  Mid LAD lesion, 40% stenosed.  Ost Cx lesion, 40% stenosed.  Ost LAD lesion, 99% stenosed. Post intervention, there is a 0% residual stenosis.  1. Severe one-vessel coronary artery disease with 99% thrombotic stenosis in the proximal/ostial LAD. Otherwise mild disease affecting the RCA and LCX.  2. Low normal LV systolic function. Mildly elevated left ventricular end-diastolic pressure. 3. Successful angioplasty and drug-eluting stent placement to the LAD.  Post cath Recommendations: Dual antiplatelet therapy for at least one year. Cardiac rehabilitation and usual post MI care.   Labs/Other Tests and Data Reviewed:    EKG:  No ECG reviewed.  Recent Labs: No results found for requested labs within last 8760 hours.   Recent Lipid Panel Lab Results  Component Value Date/Time   CHOL 156 07/10/2018 08:35 AM   TRIG 274 (H) 07/10/2018 08:35 AM   HDL 63 07/10/2018 08:35 AM   CHOLHDL 2.5 07/10/2018 08:35 AM   LDLCALC 38 07/10/2018 08:35 AM    Wt Readings from Last 3 Encounters:  09/17/19 210 lb (95.3 kg)  11/11/18 213 lb 13.5 oz (97 kg)  10/23/18  214 lb (97.1 kg)     Objective:    Vital Signs:  Ht 5\' 11"  (1.803 m)   Wt 210 lb (95.3 kg)   BMI 29.29 kg/m    Today's Vitals   09/17/19 1126  Weight: 210 lb (95.3 kg)  Height: 5\' 11"  (1.803 m)   Body mass index is 29.29 kg/m.  Normal affect. Normal speech pattern and tone. Comfortable, no apparent distress. No audible signs of SOB or wheezing.   ASSESSMENT & PLAN:    1. CAD - no recent symptoms, continue current meds  2. HTN -has been at goal, continue current meds  3. Hyperlipidemia - continue statin, LDL has been at goal - discussed repeating labs since he is in between pcp's, he favors to wait for bloodwork with new pcp   COVID-19 Education: The signs and symptoms of COVID-19 were discussed with the patient and how to seek care for testing (follow up with PCP or arrange  E-visit).  The importance of social distancing was discussed today.  Time:   Today, I have spent 13 minutes with the patient with telehealth technology discussing the above problems.     Medication Adjustments/Labs and Tests Ordered: Current medicines are reviewed at length with the patient today.  Concerns regarding medicines are outlined above.   Tests Ordered: No orders of the defined types were placed in this encounter.   Medication Changes: No orders of the defined types were placed in this encounter.   Follow Up:  In Person in 1 year(s)  Signed, Carlyle Dolly, MD  09/17/2019 11:41 AM    [place]

## 2019-09-17 NOTE — Patient Instructions (Signed)

## 2020-08-17 ENCOUNTER — Other Ambulatory Visit: Payer: Self-pay | Admitting: Cardiology

## 2021-08-28 ENCOUNTER — Other Ambulatory Visit: Payer: Self-pay | Admitting: Cardiology

## 2021-10-07 ENCOUNTER — Ambulatory Visit: Payer: Managed Care, Other (non HMO) | Admitting: Cardiology

## 2021-12-09 ENCOUNTER — Other Ambulatory Visit: Payer: Self-pay

## 2021-12-09 ENCOUNTER — Encounter: Payer: Self-pay | Admitting: Cardiology

## 2021-12-09 ENCOUNTER — Other Ambulatory Visit (HOSPITAL_COMMUNITY)
Admission: RE | Admit: 2021-12-09 | Discharge: 2021-12-09 | Disposition: A | Discharge status: Home / Self Care | Payer: Managed Care, Other (non HMO) | Source: Ambulatory Visit | Attending: Cardiology | Admitting: Cardiology

## 2021-12-09 ENCOUNTER — Ambulatory Visit: Payer: Managed Care, Other (non HMO) | Admitting: Cardiology

## 2021-12-09 VITALS — BP 160/90 | HR 82 | Ht 71.0 in | Wt 233.2 lb

## 2021-12-09 DIAGNOSIS — E782 Mixed hyperlipidemia: Secondary | ICD-10-CM | POA: Insufficient documentation

## 2021-12-09 DIAGNOSIS — I2510 Atherosclerotic heart disease of native coronary artery without angina pectoris: Secondary | ICD-10-CM | POA: Diagnosis present

## 2021-12-09 DIAGNOSIS — Z125 Encounter for screening for malignant neoplasm of prostate: Secondary | ICD-10-CM

## 2021-12-09 DIAGNOSIS — I10 Essential (primary) hypertension: Secondary | ICD-10-CM

## 2021-12-09 DIAGNOSIS — R5383 Other fatigue: Secondary | ICD-10-CM

## 2021-12-09 DIAGNOSIS — Z131 Encounter for screening for diabetes mellitus: Secondary | ICD-10-CM | POA: Insufficient documentation

## 2021-12-09 LAB — COMPREHENSIVE METABOLIC PANEL
ALT: 23 U/L (ref 0–44)
AST: 26 U/L (ref 15–41)
Albumin: 3.7 g/dL (ref 3.5–5.0)
Alkaline Phosphatase: 31 U/L — ABNORMAL LOW (ref 38–126)
Anion gap: 6 (ref 5–15)
BUN: 12 mg/dL (ref 8–23)
CO2: 24 mmol/L (ref 22–32)
Calcium: 8.9 mg/dL (ref 8.9–10.3)
Chloride: 108 mmol/L (ref 98–111)
Creatinine, Ser: 0.9 mg/dL (ref 0.61–1.24)
GFR, Estimated: >60 mL/min (ref >60)
Glucose, Bld: 113 mg/dL — ABNORMAL HIGH (ref 70–99)
Potassium: 4.2 mmol/L (ref 3.5–5.1)
Sodium: 138 mmol/L (ref 135–145)
Total Bilirubin: 0.6 mg/dL (ref 0.3–1.2)
Total Protein: 6.9 g/dL (ref 6.5–8.1)

## 2021-12-09 LAB — TSH: TSH: 2.838 u[IU]/mL (ref 0.350–4.500)

## 2021-12-09 LAB — CBC
HCT: 42.2 % (ref 39.0–52.0)
Hemoglobin: 14.3 g/dL (ref 13.0–17.0)
MCH: 33.6 pg (ref 26.0–34.0)
MCHC: 33.9 g/dL (ref 30.0–36.0)
MCV: 99.1 fL (ref 80.0–100.0)
Platelets: 177 10*3/uL (ref 150–400)
RBC: 4.26 MIL/uL (ref 4.22–5.81)
RDW: 12.4 % (ref 11.5–15.5)
WBC: 4.6 10*3/uL (ref 4.0–10.5)
nRBC: 0 % (ref 0.0–0.2)

## 2021-12-09 LAB — LIPID PANEL
Cholesterol: 129 mg/dL (ref 0–200)
HDL: 63 mg/dL (ref >40)
LDL Cholesterol: 41 mg/dL (ref 0–99)
Total CHOL/HDL Ratio: 2 RATIO
Triglycerides: 126 mg/dL (ref <150)
VLDL: 25 mg/dL (ref 0–40)

## 2021-12-09 LAB — MAGNESIUM: Magnesium: 1.9 mg/dL (ref 1.7–2.4)

## 2021-12-09 LAB — HEMOGLOBIN A1C
Hgb A1c MFr Bld: 5.5 % (ref 4.8–5.6)
Mean Plasma Glucose: 111.15 mg/dL

## 2021-12-09 MED ORDER — LISINOPRIL 10 MG PO TABS: 10.0000 mg | ORAL_TABLET | Freq: Every day | ORAL | 3 refills | Status: DC

## 2021-12-09 NOTE — Patient Instructions (Signed)
Medication Instructions:  Your physician has recommended you make the following change in your medication:  INCREASE Lisinopril to 10 mg tablets daily  *If you need a refill on your cardiac medications before your next appointment, please call your pharmacy*   Lab Work: CBC CMET TSH A1C MAG LIPID IN TWO WEEKS: PSA If you have labs (blood work) drawn today and your tests are completely normal, you will receive your results only by: [place] (if you have MyChart) OR A paper copy in the mail If you have any lab test that is abnormal or we need to change your treatment, we will call you to review the results.   Testing/Procedures: None   Follow-Up: At [facility], you and your health needs are our priority.  As part of our continuing mission to provide you with exceptional heart care, we have created designated Provider Care Teams.  These Care Teams include your primary Cardiologist (physician) and Advanced Practice Providers (APPs -  Physician Assistants and Nurse Practitioners) who all work together to provide you with the care you need, when you need it.  We recommend signing up for the patient portal called "MyChart".  Sign up information is provided on this After Visit Summary.  MyChart is used to connect with patients for Virtual Visits (Telemedicine).  Patients are able to view lab/test results, encounter notes, upcoming appointments, etc.  Non-urgent messages can be sent to your provider as well.   To learn more about what you can do with MyChart, go to [email or web address].    Your next appointment:   6 month(s)  The format for your next appointment:   In Person  Provider:   Carlyle Dolly, MD    Other Instructions You have been scheduled for a Nursing Visit in TWO (2) WEEKS: BLOOD PRESSURE CHECK

## 2021-12-09 NOTE — Progress Notes (Signed)
Clinical Summary Devin Gardner is a 66 y.o.male seen today for follow up of the following medica problems.    1. CAD - anterior STEMI 08/2015, received DES to LAD.  - 08/2015 echo LVEF 71-06%, grade I diastolic dysfunction    - no recent chest pain, no SOB/DOE - compliant with meds   2. HTN - he is compliant with meds   3. Hyperlipidemia - 06/2018 TC 156 TG 274 HDL 63 LDL 38  - has not had recent labs  - compliant with atorvastatin           SH: works for [employer]. Bought a beach home at [facility], spent several months down there since he was working from home due to [employer] trip to [place] with girlfriend.    Past Medical History:  Diagnosis Date   CAD (coronary artery disease)    a. 09/19/15 ant STEMI s/p DES to ost LAD. Otherwise mild disease affecting the RCA and LCX.    HLD (hyperlipidemia)    Left ventricular systolic dysfunction without heart failure    a. mild. 08/2015 2D ECHO with EF 45-50%, diffuse HK, G1DD   Myocardial infarction (HCC)      No Known Allergies   Current Outpatient Medications  Medication Sig Dispense Refill   aspirin 81 MG chewable tablet Chew 1 tablet (81 mg total) by mouth daily.     atorvastatin (LIPITOR) 40 MG tablet TAKE 1 TABLET(40 MG) BY MOUTH DAILY 90 tablet 3   carvedilol (COREG) 3.125 MG tablet TAKE 1 TABLET(3.125 MG) BY MOUTH TWICE DAILY WITH A MEAL 180 tablet 3   lisinopril (ZESTRIL) 5 MG tablet TAKE 1 TABLET(5 MG) BY MOUTH DAILY 90 tablet 3   No current facility-administered medications for this visit.     Past Surgical History:  Procedure Laterality Date   CARDIAC CATHETERIZATION N/A 09/19/2015   Procedure: Left Heart Cath and Coronary Angiography;  Surgeon: Wellington Hampshire, MD;  Location: [place] [facility];  Service: Cardiovascular;  Laterality: N/A;   CARDIAC CATHETERIZATION N/A 09/19/2015   Procedure: Coronary Stent Intervention;  Surgeon: Wellington Hampshire, MD;  Location: [place] [facility]  [facility];  Service: Cardiovascular;  Laterality: N/A;   COLONOSCOPY WITH PROPOFOL N/A 11/11/2018   Procedure: COLONOSCOPY WITH PROPOFOL;  Surgeon: Daneil Dolin, MD;  [facility];  Service: Endoscopy;  Laterality: N/A;  1:45pm   HERNIA REPAIR     POLYPECTOMY  11/11/2018   Procedure: POLYPECTOMY;  Surgeon: Daneil Dolin, MD;  [facility];  Service: Endoscopy;;  (colon)     No Known Allergies    Family History  Problem Relation Age of Onset   Stroke Father    Heart attack Father 4       3 MI's died in 49's   Colon cancer Neg Hx      Social History Mr. Jodoin reports that he has never smoked. He has never used smokeless tobacco. Mr. Blubaugh reports current alcohol use of about 5.0 standard drinks per week.   Review of Systems CONSTITUTIONAL: No weight loss, fever, chills, weakness or fatigue.  HEENT: Eyes: No visual loss, blurred vision, double vision or yellow sclerae.No hearing loss, sneezing, congestion, runny nose or sore throat.  SKIN: No rash or itching.  CARDIOVASCULAR: per hpi RESPIRATORY: No shortness of breath, cough or sputum.  GASTROINTESTINAL: No anorexia, nausea, vomiting or diarrhea. No abdominal pain or blood.  GENITOURINARY: No burning on urination, no polyuria NEUROLOGICAL:  No headache, dizziness, syncope, paralysis, ataxia, numbness or tingling in the extremities. No change in bowel or bladder control.  MUSCULOSKELETAL: No muscle, back pain, joint pain or stiffness.  LYMPHATICS: No enlarged nodes. No history of splenectomy.  PSYCHIATRIC: No history of depression or anxiety.  ENDOCRINOLOGIC: No reports of sweating, cold or heat intolerance. No polyuria or polydipsia.  Marland Kitchen   Physical Examination Today's Vitals   12/09/21 0815  BP: (!) 160/90  Pulse: 82  SpO2: 95%  Weight: 233 lb 3.2 oz (105.8 kg)  Height: 5\' 11"  (1.803 m)   Body mass index is 32.52 kg/m.  Gen: resting comfortably, no acute distress HEENT: no scleral icterus,  pupils equal round and reactive, no palptable cervical adenopathy,  CV: RRR, no m/rg, no jvd Resp: Clear to auscultation bilaterally GI: abdomen is soft, non-tender, non-distended, normal bowel sounds, no hepatosplenomegaly MSK: extremities are warm, no edema.  Skin: warm, no rash Neuro:  no focal deficits Psych: appropriate affect   Diagnostic Studies 08/2015 echo  Study Conclusions  - Left ventricle: The cavity size was normal. There was mild focal   basal hypertrophy of the septum. Systolic function was mildly   reduced. The estimated ejection fraction was in the range of 45%   to 50%. Diffuse hypokinesis. There was an increased relative   contribution of atrial contraction to ventricular filling.   Doppler parameters are consistent with abnormal left ventricular   relaxation (grade 1 diastolic dysfunction).    08/2015 cath The left ventricular systolic function is normal. Prox RCA lesion, 20% stenosed. Mid LAD lesion, 40% stenosed. Ost Cx lesion, 40% stenosed. Ost LAD lesion, 99% stenosed. Post intervention, there is a 0% residual stenosis.   1. Severe one-vessel coronary artery disease with 99% thrombotic stenosis in the proximal/ostial LAD. Otherwise mild disease affecting the RCA and LCX.   2. Low normal LV systolic function. Mildly elevated left ventricular end-diastolic pressure. 3. Successful angioplasty and drug-eluting stent placement to the LAD.   Post cath Recommendations: Dual antiplatelet therapy for at least one year. Cardiac rehabilitation and usual post MI care.      Assessment and Plan  1. CAD - no symptoms, continue current meds - EKG shows SR, PACs   2. HTN -elevated today. Has gained over 20 lbs in the last 3 years or so I think leading to high bp - increase lisinopril to 10mg  daily, nursing visit for bp check 2 weeks, check bmet 2 weeks.    3. Hyperlipidemia - repeat labs, continue atorvatatin   F/u 6 months     Arnoldo Lenis,  M.D

## 2021-12-23 ENCOUNTER — Ambulatory Visit (INDEPENDENT_AMBULATORY_CARE_PROVIDER_SITE_OTHER): Payer: Managed Care, Other (non HMO)

## 2021-12-23 ENCOUNTER — Other Ambulatory Visit: Payer: Self-pay

## 2021-12-23 VITALS — BP 170/92

## 2021-12-23 DIAGNOSIS — I10 Essential (primary) hypertension: Secondary | ICD-10-CM

## 2021-12-23 NOTE — Progress Notes (Signed)
Pt presents today for Nurse Visit- BP Check. Pt denies SOB, CP, nausea, headaches, etc.

## 2021-12-29 ENCOUNTER — Telehealth: Payer: Self-pay

## 2021-12-29 MED ORDER — LISINOPRIL 20 MG PO TABS: 20.0000 mg | ORAL_TABLET | Freq: Every day | ORAL | 3 refills | Status: DC

## 2021-12-29 NOTE — Progress Notes (Signed)
Erroer, should be increase lisinopril to 20mg  daily   Zandra Abts MD

## 2021-12-29 NOTE — Telephone Encounter (Signed)
-----   Message from Arnoldo Lenis, MD sent at 12/29/2021  3:44 PM EST -----    ----- Message ----- From: Berlinda Last, CMA Sent: 12/23/2021   2:34 PM EST To: Arnoldo Lenis, MD

## 2021-12-29 NOTE — Telephone Encounter (Signed)
Pt notified and verbalized understanding.

## 2021-12-29 NOTE — Progress Notes (Signed)
BP still too high, can he increase lisinopril to 40mg  daily   Zandra Abts MD

## 2021-12-29 NOTE — Telephone Encounter (Signed)
Should be increase lisinopril to 20mg  daily     Zandra Abts MD

## 2022-01-02 ENCOUNTER — Encounter: Payer: Self-pay | Admitting: Family Medicine

## 2022-06-12 ENCOUNTER — Encounter: Payer: Self-pay | Admitting: Cardiology

## 2022-06-12 ENCOUNTER — Ambulatory Visit: Payer: Managed Care, Other (non HMO) | Admitting: Cardiology

## 2022-06-12 VITALS — BP 150/90 | HR 74 | Ht 71.0 in | Wt 233.0 lb

## 2022-06-12 DIAGNOSIS — I10 Essential (primary) hypertension: Secondary | ICD-10-CM

## 2022-06-12 DIAGNOSIS — I2510 Atherosclerotic heart disease of native coronary artery without angina pectoris: Secondary | ICD-10-CM

## 2022-06-12 DIAGNOSIS — E782 Mixed hyperlipidemia: Secondary | ICD-10-CM | POA: Diagnosis not present

## 2022-06-12 MED ORDER — LISINOPRIL 40 MG PO TABS: 40.0000 mg | ORAL_TABLET | Freq: Every day | ORAL | 3 refills | Status: DC

## 2022-06-12 NOTE — Progress Notes (Signed)
Clinical Summary Devin Gardner is a 66 y.o.male seen today for follow up of the following medica problems.    1. CAD - anterior STEMI 08/2015, received DES to LAD.  - 08/2015 echo LVEF 38-75%, grade I diastolic dysfunction    - no recent chest pains - no SOB/DOE - compliant with meds     2. HTN -compliant with meds - we have lisinorpil '20mg'$  on his list but he thinks he is taking '40mg'$ , we will check with his pharmacy   3. Hyperlipidemia Jan 2023 TC 156 TG 274 HDL 63 LDL 38       SH: works for [employer]. B[place] a [place] at [facility], spent several months down there since he was working from home due to [employer] trip to [place] with girlfriend.   Past Medical History:  Diagnosis Date   CAD (coronary artery disease)    a. 09/19/15 ant STEMI s/p DES to ost LAD. Otherwise mild disease affecting the RCA and LCX.    HLD (hyperlipidemia)    Left ventricular systolic dysfunction without heart failure    a. mild. 08/2015 2D ECHO with EF 45-50%, diffuse HK, G1DD   Myocardial infarction (HCC)      No Known Allergies   Current Outpatient Medications  Medication Sig Dispense Refill   aspirin 81 MG chewable tablet Chew 1 tablet (81 mg total) by mouth daily.     atorvastatin (LIPITOR) 40 MG tablet TAKE 1 TABLET(40 MG) BY MOUTH DAILY 90 tablet 3   carvedilol (COREG) 3.125 MG tablet TAKE 1 TABLET(3.125 MG) BY MOUTH TWICE DAILY WITH A MEAL 180 tablet 3   lisinopril (ZESTRIL) 20 MG tablet Take 1 tablet (20 mg total) by mouth daily. 90 tablet 3   No current facility-administered medications for this visit.     Past Surgical History:  Procedure Laterality Date   CARDIAC CATHETERIZATION N/A 09/19/2015   Procedure: Left Heart Cath and Coronary Angiography;  Surgeon: Wellington Hampshire, MD;  Location: [place] [facility];  Service: Cardiovascular;  Laterality: N/A;   CARDIAC CATHETERIZATION N/A 09/19/2015   Procedure: Coronary Stent Intervention;  Surgeon:  Wellington Hampshire, MD;  Location: [place] [facility];  Service: Cardiovascular;  Laterality: N/A;   COLONOSCOPY WITH PROPOFOL N/A 11/11/2018   Procedure: COLONOSCOPY WITH PROPOFOL;  Surgeon: Daneil Dolin, MD;  [facility];  Service: Endoscopy;  Laterality: N/A;  1:45pm   HERNIA REPAIR     POLYPECTOMY  11/11/2018   Procedure: POLYPECTOMY;  Surgeon: Daneil Dolin, MD;  [facility];  Service: Endoscopy;;  (colon)     No Known Allergies    Family History  Problem Relation Age of Onset   Stroke Father    Heart attack Father 18       3 MI's died in 22's   Colon cancer Neg Hx      Social History Mr. Cawley reports that he has never smoked. He has never used smokeless tobacco. Mr. Cokley reports current alcohol use of about 5.0 standard drinks of alcohol per week.   Review of Systems CONSTITUTIONAL: No weight loss, fever, chills, weakness or fatigue.  HEENT: Eyes: No visual loss, blurred vision, double vision or yellow sclerae.No hearing loss, sneezing, congestion, runny nose or sore throat.  SKIN: No rash or itching.  CARDIOVASCULAR: per hpi RESPIRATORY: No shortness of breath, cough or sputum.  GASTROINTESTINAL: No anorexia, nausea, vomiting or diarrhea. No abdominal pain or blood.  GENITOURINARY: No burning on urination, no polyuria NEUROLOGICAL: No headache, dizziness, syncope, paralysis, ataxia, numbness or tingling in the extremities. No change in bowel or bladder control.  MUSCULOSKELETAL: No muscle, back pain, joint pain or stiffness.  LYMPHATICS: No enlarged nodes. No history of splenectomy.  PSYCHIATRIC: No history of depression or anxiety.  ENDOCRINOLOGIC: No reports of sweating, cold or heat intolerance. No polyuria or polydipsia.  Marland Kitchen   Physical Examination Today's Vitals   06/12/22 1522  BP: (!) 154/84  Pulse: 74  SpO2: 97%  Weight: 233 lb (105.7 kg)  Height: '5\' 11"'$  (1.803 m)   Body mass index is 32.5 kg/m.  Gen: resting  comfortably, no acute distress HEENT: no scleral icterus, pupils equal round and reactive, no palptable cervical adenopathy,  CV: RRR, no m/r/g no jvd Resp: Clear to auscultation bilaterally GI: abdomen is soft, non-tender, non-distended, normal bowel sounds, no hepatosplenomegaly MSK: extremities are warm, no edema.  Skin: warm, no rash Neuro:  no focal deficits Psych: appropriate affect   Diagnostic Studies  08/2015 echo  Study Conclusions  - Left ventricle: The cavity size was normal. There was mild focal   basal hypertrophy of the septum. Systolic function was mildly   reduced. The estimated ejection fraction was in the range of 45%   to 50%. Diffuse hypokinesis. There was an increased relative   contribution of atrial contraction to ventricular filling.   Doppler parameters are consistent with abnormal left ventricular   relaxation (grade 1 diastolic dysfunction).    08/2015 cath The left ventricular systolic function is normal. Prox RCA lesion, 20% stenosed. Mid LAD lesion, 40% stenosed. Ost Cx lesion, 40% stenosed. Ost LAD lesion, 99% stenosed. Post intervention, there is a 0% residual stenosis.   1. Severe one-vessel coronary artery disease with 99% thrombotic stenosis in the proximal/ostial LAD. Otherwise mild disease affecting the RCA and LCX.   2. Low normal LV systolic function. Mildly elevated left ventricular end-diastolic pressure. 3. Successful angioplasty and drug-eluting stent placement to the LAD.   Post cath Recommendations: Dual antiplatelet therapy for at least one year. Cardiac rehabilitation and usual post MI care.   Assessment and Plan  1. CAD - no symptoms,continue current meds   2. HTN -above goal, will check with pharmacy on lisinopril dose. Would increase to '40mg'$  daily if not already taking, if already on '40mg'$  then increase coreg to 6.'25mg'$  bid. Nursing visit 2 weeks, if increase lisinopril bmet 2 weeks.    3. Hyperlipidemia - LDL at goal,  continue current meds. Discussed dietary changes to imrpove TGs   F/u 6 months      Arnoldo Lenis, M.D

## 2022-06-12 NOTE — Patient Instructions (Addendum)
Medication Instructions:  Your physician has recommended you make the following change in your medication:  Increase Lisinopril to 40 mg tablets daily.   Labwork: In 2 weeks: BMET  Testing/Procedures: None  Follow-Up: Follow up in 6 months with Dr. Harl Bowie  Any Other Special Instructions Will Be Listed Below (If Applicable).   Follow up Nurse Visit in 3 weeks- BP check    If you need a refill on your cardiac medications before your next appointment, please call your pharmacy.

## 2022-06-26 ENCOUNTER — Other Ambulatory Visit (HOSPITAL_COMMUNITY)
Admission: RE | Admit: 2022-06-26 | Discharge: 2022-06-26 | Disposition: A | Discharge status: Home / Self Care | Payer: Managed Care, Other (non HMO) | Source: Ambulatory Visit | Attending: Cardiology | Admitting: Cardiology

## 2022-06-26 DIAGNOSIS — I2510 Atherosclerotic heart disease of native coronary artery without angina pectoris: Secondary | ICD-10-CM | POA: Diagnosis present

## 2022-06-26 DIAGNOSIS — I10 Essential (primary) hypertension: Secondary | ICD-10-CM | POA: Insufficient documentation

## 2022-06-26 LAB — BASIC METABOLIC PANEL
Anion gap: 6 (ref 5–15)
BUN: 12 mg/dL (ref 8–23)
CO2: 27 mmol/L (ref 22–32)
Calcium: 9.1 mg/dL (ref 8.9–10.3)
Chloride: 108 mmol/L (ref 98–111)
Creatinine, Ser: 1 mg/dL (ref 0.61–1.24)
GFR, Estimated: >60 mL/min (ref >60)
Glucose, Bld: 118 mg/dL — ABNORMAL HIGH (ref 70–99)
Potassium: 4.7 mmol/L (ref 3.5–5.1)
Sodium: 141 mmol/L (ref 135–145)

## 2022-07-03 ENCOUNTER — Ambulatory Visit (INDEPENDENT_AMBULATORY_CARE_PROVIDER_SITE_OTHER): Payer: Managed Care, Other (non HMO)

## 2022-07-03 VITALS — BP 140/76 | HR 74

## 2022-07-03 DIAGNOSIS — Z0130 Encounter for examination of blood pressure without abnormal findings: Secondary | ICD-10-CM

## 2022-07-03 NOTE — Progress Notes (Signed)
Nurse visit today BP check  140/76   Zestril increased to 40 mg per MD    Patient admits to using salt on food daily. He says he will try to take salt shaker away.    I will provide a 2 Gram sodium guideline to him

## 2022-07-03 NOTE — Patient Instructions (Signed)
I will call you with Dr.Branch's medication instructions   Follow the 2 gram sodium guideline

## 2022-08-16 ENCOUNTER — Other Ambulatory Visit: Payer: Self-pay | Admitting: Cardiology

## 2022-12-21 ENCOUNTER — Ambulatory Visit: Payer: Managed Care, Other (non HMO) | Attending: Cardiology | Admitting: Cardiology

## 2022-12-21 ENCOUNTER — Other Ambulatory Visit (HOSPITAL_COMMUNITY)
Admission: RE | Admit: 2022-12-21 | Discharge: 2022-12-21 | Disposition: A | Discharge status: Home / Self Care | Payer: Managed Care, Other (non HMO) | Source: Ambulatory Visit | Attending: Cardiology | Admitting: Cardiology

## 2022-12-21 ENCOUNTER — Encounter: Payer: Self-pay | Admitting: Cardiology

## 2022-12-21 VITALS — BP 136/76 | HR 83 | Ht 71.0 in | Wt 235.0 lb

## 2022-12-21 DIAGNOSIS — E782 Mixed hyperlipidemia: Secondary | ICD-10-CM | POA: Diagnosis not present

## 2022-12-21 DIAGNOSIS — I2510 Atherosclerotic heart disease of native coronary artery without angina pectoris: Secondary | ICD-10-CM | POA: Insufficient documentation

## 2022-12-21 DIAGNOSIS — I10 Essential (primary) hypertension: Secondary | ICD-10-CM | POA: Diagnosis not present

## 2022-12-21 LAB — CBC
HCT: 40.4 % (ref 39.0–52.0)
Hemoglobin: 13.7 g/dL (ref 13.0–17.0)
MCH: 34.7 pg — ABNORMAL HIGH (ref 26.0–34.0)
MCHC: 33.9 g/dL (ref 30.0–36.0)
MCV: 102.3 fL — ABNORMAL HIGH (ref 80.0–100.0)
Platelets: 152 10*3/uL (ref 150–400)
RBC: 3.95 MIL/uL — ABNORMAL LOW (ref 4.22–5.81)
RDW: 12.9 % (ref 11.5–15.5)
WBC: 6.9 10*3/uL (ref 4.0–10.5)
nRBC: 0 % (ref 0.0–0.2)

## 2022-12-21 LAB — COMPREHENSIVE METABOLIC PANEL
ALT: 30 U/L (ref 0–44)
AST: 43 U/L — ABNORMAL HIGH (ref 15–41)
Albumin: 3.8 g/dL (ref 3.5–5.0)
Alkaline Phosphatase: 33 U/L — ABNORMAL LOW (ref 38–126)
Anion gap: 9 (ref 5–15)
BUN: 9 mg/dL (ref 8–23)
CO2: 24 mmol/L (ref 22–32)
Calcium: 9.1 mg/dL (ref 8.9–10.3)
Chloride: 103 mmol/L (ref 98–111)
Creatinine, Ser: 0.99 mg/dL (ref 0.61–1.24)
GFR, Estimated: >60 mL/min (ref >60)
Glucose, Bld: 124 mg/dL — ABNORMAL HIGH (ref 70–99)
Potassium: 4.7 mmol/L (ref 3.5–5.1)
Sodium: 136 mmol/L (ref 135–145)
Total Bilirubin: 1 mg/dL (ref 0.3–1.2)
Total Protein: 7.3 g/dL (ref 6.5–8.1)

## 2022-12-21 LAB — LIPID PANEL
Cholesterol: 125 mg/dL (ref 0–200)
HDL: 56 mg/dL (ref >40)
LDL Cholesterol: 43 mg/dL (ref 0–99)
Total CHOL/HDL Ratio: 2.2 RATIO
Triglycerides: 131 mg/dL (ref <150)
VLDL: 26 mg/dL (ref 0–40)

## 2022-12-21 LAB — TSH: TSH: 3.385 u[IU]/mL (ref 0.350–4.500)

## 2022-12-21 LAB — HEMOGLOBIN A1C
Hgb A1c MFr Bld: 5.4 % (ref 4.8–5.6)
Mean Plasma Glucose: 108.28 mg/dL

## 2022-12-21 LAB — MAGNESIUM: Magnesium: 2.1 mg/dL (ref 1.7–2.4)

## 2022-12-21 NOTE — Patient Instructions (Signed)
Medication Instructions:  Your physician recommends that you continue on your current medications as directed. Please refer to the Current Medication list given to you today.   Labwork: Labs ordered by Dr. Harl Bowie can be completed at the [facility] lab M-F 8a-3:30 pm   Testing/Procedures: None  Follow-Up: Follow up with Dr. Harl Bowie in 6 months.   Any Other Special Instructions Will Be Listed Below (If Applicable).     If you need a refill on your cardiac medications before your next appointment, please call your pharmacy.

## 2022-12-21 NOTE — Progress Notes (Signed)
Clinical Summary Devin Gardner is a 67 y.o.male seen today for follow up of the following medica problems.    1. CAD - anterior STEMI 08/2015, received DES to LAD.  - 08/2015 echo LVEF 40-10%, grade I diastolic dysfunction     - no chest pains, no SOB/DOE - compliant with meds       2. HTN -he is compliant with meds - home bp's 130s/70s   3. Hyperlipidemia Jan 2023 TC 156 TG 274 HDL 63 LDL 38       SH: works for [employer]. Bought a beach home at [facility], spent several months down there since he was working from home due to [employer] trip to [place] with girlfriend.   Past Medical History:  Diagnosis Date   CAD (coronary artery disease)    a. 09/19/15 ant STEMI s/p DES to ost LAD. Otherwise mild disease affecting the RCA and LCX.    HLD (hyperlipidemia)    Left ventricular systolic dysfunction without heart failure    a. mild. 08/2015 2D ECHO with EF 45-50%, diffuse HK, G1DD   Myocardial infarction (HCC)      No Known Allergies   Current Outpatient Medications  Medication Sig Dispense Refill   aspirin 81 MG chewable tablet Chew 1 tablet (81 mg total) by mouth daily.     atorvastatin (LIPITOR) 40 MG tablet TAKE 1 TABLET(40 MG) BY MOUTH DAILY 90 tablet 3   carvedilol (COREG) 3.125 MG tablet TAKE 1 TABLET(3.125 MG) BY MOUTH TWICE DAILY WITH A MEAL 180 tablet 3   lisinopril (ZESTRIL) 40 MG tablet Take 1 tablet (40 mg total) by mouth daily. 90 tablet 3   No current facility-administered medications for this visit.     Past Surgical History:  Procedure Laterality Date   CARDIAC CATHETERIZATION N/A 09/19/2015   Procedure: Left Heart Cath and Coronary Angiography;  Surgeon: Wellington Hampshire, MD;  Location: [facility];  Service: Cardiovascular;  Laterality: N/A;   CARDIAC CATHETERIZATION N/A 09/19/2015   Procedure: Coronary Stent Intervention;  Surgeon: Wellington Hampshire, MD;  Location: [facility];  Service:  Cardiovascular;  Laterality: N/A;   COLONOSCOPY WITH PROPOFOL N/A 11/11/2018   Procedure: COLONOSCOPY WITH PROPOFOL;  Surgeon: Daneil Dolin, MD;  [facility];  Service: Endoscopy;  Laterality: N/A;  1:45pm   HERNIA REPAIR     POLYPECTOMY  11/11/2018   Procedure: POLYPECTOMY;  Surgeon: Daneil Dolin, MD;  [facility];  Service: Endoscopy;;  (colon)     No Known Allergies    Family History  Problem Relation Age of Onset   Stroke Father    Heart attack Father 49       3 MI's died in 67's   Colon cancer Neg Hx      Social History Mr. Mcclimans reports that he has never smoked. He has never used smokeless tobacco. Mr. Harig reports current alcohol use of about 5.0 standard drinks of alcohol per week.   Review of Systems CONSTITUTIONAL: No weight loss, fever, chills, weakness or fatigue.  HEENT: Eyes: No visual loss, blurred vision, double vision or yellow sclerae.No hearing loss, sneezing, congestion, runny nose or sore throat.  SKIN: No rash or itching.  CARDIOVASCULAR: per hpi RESPIRATORY: No shortness of breath, cough or sputum.  GASTROINTESTINAL: No anorexia, nausea, vomiting or diarrhea. No abdominal pain or blood.  GENITOURINARY: No burning on urination, no polyuria NEUROLOGICAL: No headache, dizziness, syncope, paralysis,  ataxia, numbness or tingling in the extremities. No change in bowel or bladder control.  MUSCULOSKELETAL: No muscle, back pain, joint pain or stiffness.  LYMPHATICS: No enlarged nodes. No history of splenectomy.  PSYCHIATRIC: No history of depression or anxiety.  ENDOCRINOLOGIC: No reports of sweating, cold or heat intolerance. No polyuria or polydipsia.  Marland Kitchen   Physical Examination Today's Vitals   12/21/22 0817  BP: 136/76  Pulse: 83  SpO2: 96%  Weight: 235 lb (106.6 kg)  Height: '5\' 11"'$  (1.803 m)   Body mass index is 32.78 kg/m.  Gen: resting comfortably, no acute distress HEENT: no scleral icterus, pupils equal round  and reactive, no palptable cervical adenopathy,  CV: RRR, no m/r/g no jvd Resp: Clear to auscultation bilaterally GI: abdomen is soft, non-tender, non-distended, normal bowel sounds, no hepatosplenomegaly MSK: extremities are warm, no edema.  Skin: warm, no rash Neuro:  no focal deficits Psych: appropriate affect   Diagnostic Studies 08/2015 echo  Study Conclusions  - Left ventricle: The cavity size was normal. There was mild focal   basal hypertrophy of the septum. Systolic function was mildly   reduced. The estimated ejection fraction was in the range of 45%   to 50%. Diffuse hypokinesis. There was an increased relative   contribution of atrial contraction to ventricular filling.   Doppler parameters are consistent with abnormal left ventricular   relaxation (grade 1 diastolic dysfunction).    08/2015 cath The left ventricular systolic function is normal. Prox RCA lesion, 20% stenosed. Mid LAD lesion, 40% stenosed. Ost Cx lesion, 40% stenosed. Ost LAD lesion, 99% stenosed. Post intervention, there is a 0% residual stenosis.   1. Severe one-vessel coronary artery disease with 99% thrombotic stenosis in the proximal/ostial LAD. Otherwise mild disease affecting the RCA and LCX.   2. Low normal LV systolic function. Mildly elevated left ventricular end-diastolic pressure. 3. Successful angioplasty and drug-eluting stent placement to the LAD.   Post cath Recommendations: Dual antiplatelet therapy for at least one year. Cardiac rehabilitation and usual post MI care.      Assessment and Plan  1. CAD - no recent symptoms, continue current meds - EKG today SR, no ischemic changes   2. HTN -essentially at goal, room to tirate coreg in the future if needed - continue curren tmeds   3. Hyperlipidemia - continue atorvastatin, repeat lipid panel   We will obtain his annual labs  F/u 6 months      Arnoldo Lenis, M.D.

## 2023-01-01 ENCOUNTER — Telehealth: Payer: Self-pay

## 2023-01-01 NOTE — Telephone Encounter (Signed)
-----   Message from Arnoldo Lenis, MD sent at 12/31/2022 10:53 AM EST ----- Labs look fine, no changes  Zandra Abts MD

## 2023-01-01 NOTE — Telephone Encounter (Signed)
Patient notified and verbalized understanding. Patient had no questions or concerns at this time.  

## 2023-05-23 ENCOUNTER — Other Ambulatory Visit: Payer: Self-pay | Admitting: Cardiology

## 2023-07-20 ENCOUNTER — Encounter: Payer: Self-pay | Admitting: Cardiology

## 2023-07-20 ENCOUNTER — Ambulatory Visit: Payer: Managed Care, Other (non HMO) | Admitting: Cardiology

## 2023-07-20 ENCOUNTER — Ambulatory Visit: Payer: Managed Care, Other (non HMO) | Attending: Cardiology | Admitting: Cardiology

## 2023-07-20 VITALS — BP 130/74 | HR 78 | Ht 71.0 in | Wt 237.0 lb

## 2023-07-20 DIAGNOSIS — I2510 Atherosclerotic heart disease of native coronary artery without angina pectoris: Secondary | ICD-10-CM

## 2023-07-20 DIAGNOSIS — E782 Mixed hyperlipidemia: Secondary | ICD-10-CM

## 2023-07-20 DIAGNOSIS — I10 Essential (primary) hypertension: Secondary | ICD-10-CM

## 2023-07-20 NOTE — Progress Notes (Signed)
Clinical Summary Mr. Turnbaugh is a 67 y.o.male seen today for follow up of the following medica problems.      1. CAD - anterior STEMI 08/2015, received DES to LAD.  - 08/2015 echo LVEF 45-50%, grade I diastolic dysfunction   - no chest pain, no SOB/DOE - compliant with meds       2. HTN - compliant with meds    3. Hyperlipidemia Jan 2023 TC 156 TG 274 HDL 63 LDL 38 - Jan 2024 TC 125 TG 131 HDL 56 LDL 43     SH: works for [employer]. Bought a beach home at [facility]   Past Medical History:  Diagnosis Date   CAD (coronary artery disease)    a. 09/19/15 ant STEMI s/p DES to ost LAD. Otherwise mild disease affecting the RCA and LCX.    HLD (hyperlipidemia)    Left ventricular systolic dysfunction without heart failure    a. mild. 08/2015 2D ECHO with EF 45-50%, diffuse HK, G1DD   Myocardial infarction (HCC)      No Known Allergies   Current Outpatient Medications  Medication Sig Dispense Refill   aspirin 81 MG chewable tablet Chew 1 tablet (81 mg total) by mouth daily.     atorvastatin (LIPITOR) 40 MG tablet TAKE 1 TABLET(40 MG) BY MOUTH DAILY 90 tablet 3   carvedilol (COREG) 3.125 MG tablet TAKE 1 TABLET(3.125 MG) BY MOUTH TWICE DAILY WITH A MEAL 180 tablet 3   lisinopril (ZESTRIL) 40 MG tablet TAKE 1 TABLET(40 MG) BY MOUTH DAILY 90 tablet 3   tadalafil (CIALIS) 20 MG tablet Take 10-20 mg by mouth daily as needed.     No current facility-administered medications for this visit.     Past Surgical History:  Procedure Laterality Date   CARDIAC CATHETERIZATION N/A 09/19/2015   Procedure: Left Heart Cath and Coronary Angiography;  Surgeon: Iran Ouch, MD;  [facility];  Service: Cardiovascular;  Laterality: N/A;   CARDIAC CATHETERIZATION N/A 09/19/2015   Procedure: Coronary Stent Intervention;  Surgeon: Iran Ouch, MD;  Location: [facility];  Service: Cardiovascular;  Laterality: N/A;    COLONOSCOPY WITH PROPOFOL N/A 11/11/2018   Procedure: COLONOSCOPY WITH PROPOFOL;  Surgeon: Corbin Ade, MD;  [facility];  Service: Endoscopy;  Laterality: N/A;  1:45pm   HERNIA REPAIR     POLYPECTOMY  11/11/2018   Procedure: POLYPECTOMY;  Surgeon: Corbin Ade, MD;  [facility];  Service: Endoscopy;;  (colon)     No Known Allergies    Family History  Problem Relation Age of Onset   Stroke Father    Heart attack Father 30       3 MI's died in 56's   Colon cancer Neg Hx      Social History Mr. Dozois reports that he has never smoked. He has never used smokeless tobacco. Mr. Yousef reports current alcohol use of about 5.0 standard drinks of alcohol per week.   Review of Systems CONSTITUTIONAL: No weight loss, fever, chills, weakness or fatigue.  HEENT: Eyes: No visual loss, blurred vision, double vision or yellow sclerae.No hearing loss, sneezing, congestion, runny nose or sore throat.  SKIN: No rash or itching.  CARDIOVASCULAR: per hpi RESPIRATORY: No shortness of breath, cough or sputum.  GASTROINTESTINAL: No anorexia, nausea, vomiting or diarrhea. No abdominal pain or blood.  GENITOURINARY: No burning on urination, no polyuria NEUROLOGICAL: No headache, dizziness, syncope, paralysis,  ataxia, numbness or tingling in the extremities. No change in bowel or bladder control.  MUSCULOSKELETAL: No muscle, back pain, joint pain or stiffness.  LYMPHATICS: No enlarged nodes. No history of splenectomy.  PSYCHIATRIC: No history of depression or anxiety.  ENDOCRINOLOGIC: No reports of sweating, cold or heat intolerance. No polyuria or polydipsia.  Marland Kitchen   Physical Examination Today's Vitals   07/20/23 1149 07/20/23 1227  BP: 138/76 130/74  Pulse: 78   SpO2: 96%   Weight: 237 lb (107.5 kg)   Height: 5\' 11"  (1.803 m)    Body mass index is 33.05 kg/m.  Gen: resting comfortably, no acute distress HEENT: no scleral icterus, pupils equal round and  reactive, no palptable cervical adenopathy,  CV: RRR, no m/rg, no jvd Resp: Clear to auscultation bilaterally GI: abdomen is soft, non-tender, non-distended, normal bowel sounds, no hepatosplenomegaly MSK: extremities are warm, no edema.  Skin: warm, no rash Neuro:  no focal deficits Psych: appropriate affect   Diagnostic Studies  08/2015 echo  Study Conclusions  - Left ventricle: The cavity size was normal. There was mild focal   basal hypertrophy of the septum. Systolic function was mildly   reduced. The estimated ejection fraction was in the range of 45%   to 50%. Diffuse hypokinesis. There was an increased relative   contribution of atrial contraction to ventricular filling.   Doppler parameters are consistent with abnormal left ventricular   relaxation (grade 1 diastolic dysfunction).    08/2015 cath The left ventricular systolic function is normal. Prox RCA lesion, 20% stenosed. Mid LAD lesion, 40% stenosed. Ost Cx lesion, 40% stenosed. Ost LAD lesion, 99% stenosed. Post intervention, there is a 0% residual stenosis.   1. Severe one-vessel coronary artery disease with 99% thrombotic stenosis in the proximal/ostial LAD. Otherwise mild disease affecting the RCA and LCX.   2. Low normal LV systolic function. Mildly elevated left ventricular end-diastolic pressure. 3. Successful angioplasty and drug-eluting stent placement to the LAD.   Post cath Recommendations: Dual antiplatelet therapy for at least one year. Cardiac rehabilitation and usual post MI care.   Assessment and Plan   1. CAD -no symptoms, continue current meds   2. HTN -bp is at goal, continue current meds   3. Hyperlipidemia - lipids are at goal, contniue current meds  F/u 6 months     Antoine Poche, M.D.

## 2023-07-20 NOTE — Patient Instructions (Signed)
Medication Instructions:  Your physician recommends that you continue on your current medications as directed. Please refer to the Current Medication list given to you today.  *If you need a refill on your cardiac medications before your next appointment, please call your pharmacy*   Lab Work: None If you have labs (blood work) drawn today and your tests are completely normal, you will receive your results only by: MyChart Message (if you have MyChart) OR A paper copy in the mail If you have any lab test that is abnormal or we need to change your treatment, we will call you to review the results.   Testing/Procedures: None   Follow-Up: At [facility], you and your health needs are our priority.  As part of our continuing mission to provide you with exceptional heart care, we have created designated Provider Care Teams.  These Care Teams include your primary Cardiologist (physician) and Advanced Practice Providers (APPs -  Physician Assistants and Nurse Practitioners) who all work together to provide you with the care you need, when you need it.  We recommend signing up for the patient portal called "MyChart".  Sign up information is provided on this After Visit Summary.  MyChart is used to connect with patients for Virtual Visits (Telemedicine).  Patients are able to view lab/test results, encounter notes, upcoming appointments, etc.  Non-urgent messages can be sent to your provider as well.   To learn more about what you can do with MyChart, go to [email or web address].    Your next appointment:   6 month(s)  Provider:   Jonathan Branch, MD    Other Instructions    

## 2023-08-12 ENCOUNTER — Other Ambulatory Visit: Payer: Self-pay | Admitting: Cardiology

## 2023-10-09 ENCOUNTER — Telehealth: Payer: Self-pay

## 2023-10-09 NOTE — Telephone Encounter (Signed)
   Name: Devin Gardner  DOB: 1956-08-05  MRN: [id number]  Primary Cardiologist: Dina Rich, MD   Preoperative team, please contact this patient and set up a phone call appointment for further preoperative risk assessment. Please obtain consent and complete medication review. Thank you for your help.  I confirm that guidance regarding antiplatelet and oral anticoagulation therapy has been completed and, if necessary, noted below.   Per office protocol, if patient is without any new symptoms or concerns at the time of their virtual visit, he/she may hold ASA for 7 days prior to procedure. Please resume ASA as soon as possible postprocedure, at the discretion of the surgeon.    I also confirmed the patient resides in the state of [place]. As per [facility] Medical Board telemedicine laws, the patient must reside in the state in which the provider is licensed.   Joni Reining, NP 10/09/2023, 3:28 PM [facility]

## 2023-10-09 NOTE — Telephone Encounter (Signed)
I s/w the pt about tele appt for pre op clearance. Pt is also scheduled for appt with Dr. Wyline Mood 01/23/24 day after his surgery that is planned for 01/22/24 hips surgery. I presented options to the pt that we could do a tele pre op appt, though I am sure he is not going to be able to come to the office for an in office after having surgery the day before. I gave the option as well that we could move the in office appt sooner. I offered an appt with Dr. Wyline Mood but the pt will be at a conference. Pt asked to come in sooner maybe in Jan. Pt opted to see Ronie Spies, [facility] 12/24/23 for the pre op clearance.the pt states he is hoping that his surgery may be moved up sooner, possibly. I did tell the pt that he needs to d/w [facility] about the 01/23/24 appt with MD, to either move out a bit or if still needed after the 12/24/23 visit.   I will update all parties involved.

## 2023-10-09 NOTE — Telephone Encounter (Signed)
   Pre-operative Risk Assessment    Patient Name: Devin Gardner  DOB: 06-12-1956 MRN: [id number]     DATE OF LAST OFFICE VISIT: 07/20/23 WITH DR. Dina Rich  DATE OF NEXT OFFICE VISIT: 01/23/24 WITH DR. Dina Rich  Request for Surgical Clearance    Procedure:   RIGHT TOTAL HIP ARTHROPLASTY   Date of Surgery:  Clearance 01/22/24                                 Surgeon:  DR. Ollen Gross Surgeon's Group or Practice Name:  [facility] Phone number:  [phone] Fax number:  [phone] ATTN: Aida Raider   Type of Clearance Requested:   - Medical  - Pharmacy:  Hold Aspirin     Type of Anesthesia:   CHOICE    Additional requests/questions:    Robley Fries   10/09/2023, 2:19 PM

## 2023-11-13 ENCOUNTER — Encounter (INDEPENDENT_AMBULATORY_CARE_PROVIDER_SITE_OTHER): Payer: Self-pay | Admitting: *Deleted

## 2023-11-29 ENCOUNTER — Encounter (INDEPENDENT_AMBULATORY_CARE_PROVIDER_SITE_OTHER): Payer: Self-pay | Admitting: *Deleted

## 2023-12-20 NOTE — Progress Notes (Addendum)
Cardiology Office Note    Date:  12/24/2023  ID:  Devin Gardner, Devin Gardner 08-12-1956, MRN [id number] PCP:  Danelle Berry, PA-C  Cardiologist:  Dina Rich, MD  Electrophysiologist:  None   Chief Complaint: preop evaluation  History of Present Illness: .    Devin Gardner is a 68 y.o. male with visit-pertinent history of CAD with anterior STEMI s/p DES to prox-ostial LAD, mild ICM EF 45-50% by echo 2016, HTN, HLD seen for pre-op evaluation. Cardiac cath 2016 showed 20% prox RCA, 40% mLAD, 40% ostial Cx, 99% ostial LAD tx with DES, low normal LV systolic function, mildly elevated LVEDP. 2D echo at that time showed EF 45-50%, diffuse HK, G1DD, mild focal basal hypertrophy.  He is seen for follow-up today feeling well without any CP or SOB. He has not had any recent angina. He does not routinely exercise in a program but reports his job is very active. 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 He also does housework and yardwork without cardiac symptoms. He used to do sports but no longer does because of the limitation from hip pain.   Labwork independently reviewed: [facility] DXA 10/2023 Hgb 12.5, plt OK, TSH OK, K 4.5, Cr 0.93, LFTs ok, trig 196, LDL 39, A1c 5.4 11/2022 K 4.7, Cr 0.99, AST 43, Hgb 13.7, plt 152, A1c 5.4, Mg 2.1, LDL 43, trig 131, TSH wnl   ROS: .    Please see the history of present illness.  All other systems are reviewed and otherwise negative.  Studies Reviewed: Marland Kitchen    EKG:  EKG is ordered today, personally reviewed, demonstrating NSR 70bpm with low p wave amplitude best seen in lead V3 with first degree AVB, no acute STT changes  CV Studies: Cardiac studies reviewed are outlined and summarized above. Otherwise please see EMR for full report.   Current Reported Medications:.    Current Meds  Medication Sig   aspirin 81 MG chewable tablet Chew 1 tablet (81 mg total) by mouth daily.   atorvastatin (LIPITOR) 40 MG tablet TAKE 1  TABLET(40 MG) BY MOUTH DAILY   carvedilol (COREG) 3.125 MG tablet TAKE 1 TABLET(3.125 MG) BY MOUTH TWICE DAILY WITH A MEAL   lisinopril (ZESTRIL) 40 MG tablet TAKE 1 TABLET(40 MG) BY MOUTH DAILY   tadalafil (CIALIS) 20 MG tablet Take 10-20 mg by mouth daily as needed.    Physical Exam:    VS:  BP 134/80 (BP Location: Left Arm, Patient Position: Sitting, Cuff Size: Normal)   Pulse 65   Ht 5\' 11"  (1.803 m)   Wt 237 lb 12.8 oz (107.9 kg)   SpO2 97%   BMI 33.17 kg/m    Wt Readings from Last 3 Encounters:  12/24/23 237 lb 12.8 oz (107.9 kg)  07/20/23 237 lb (107.5 kg)  12/21/22 235 lb (106.6 kg)    GEN: Well nourished, well developed in no acute distress NECK: No JVD; No carotid bruits CARDIAC: RRR, no murmurs, rubs, gallops RESPIRATORY:  Clear to auscultation without rales, wheezing or rhonchi  ABDOMEN: Soft, non-tender, non-distended EXTREMITIES:  No edema; No acute deformity   Asessement and Plan:.    1. Preop cardiovascular evaluation - RCRI is 2, indicating 10.1% risk of major cardiac event. He is able to complete 7.25 METS without angina or dyspnea. He has not had repeat echo since his original event. Will update echocardiogram. If LVEF is stable, anticipate granting OK to proceed  without further CV testing. (Already confirmed with Dr. Wyline Mood would be OK to hold ASA once cleared.) I will route a copy of this note to surgeon and will also let them know once echocardiogram has been completed with final pre-op recommendation. Patient knows to inform for any new/worsening cardiac symptoms. Addendum 01/09/24: 2D echo is stable with improved LVEF from prior at 55-60%, mild LVH, no new acute concerns. OK to proceed without further CV testing. As previously stated, I had discussed aspirin with Dr. Wyline Mood who gave OK to hold if surgeon requests to hold prior to surgery (typical duration is up to 7 days). Please resume when felt safe from surgical standpoint. Will route updated copy of note to  requesting office via Epic fax function.  2. CAD, HLD - no recent angina. Continue ASA, atorvastatin, carvedilol at present doses. Lipids have been followed by primary care.  3. Essential HTN - BP suboptimally controlled, goal <130/80. Repeat by me 138/82. We need a better idea of what it's running at home. The patient was provided instructions on monitoring blood pressure/heart rate at home for 1 week and relaying results to our office. Would consider titrating carvedilol to 6.25mg  BID if HR trends are OK.  4. Mild ICM - appears euvolemic. Rechecking echo as above.    Disposition: F/u with Dr. Wyline Mood in 6 months as long as echo stable.  Signed, Laurann Montana, PA-C

## 2023-12-24 ENCOUNTER — Encounter: Payer: Self-pay | Admitting: Physician Assistant

## 2023-12-24 ENCOUNTER — Ambulatory Visit: Payer: Managed Care, Other (non HMO) | Attending: Physician Assistant | Admitting: Physician Assistant

## 2023-12-24 VITALS — BP 138/82 | HR 65 | Ht 71.0 in | Wt 237.8 lb

## 2023-12-24 DIAGNOSIS — E785 Hyperlipidemia, unspecified: Secondary | ICD-10-CM

## 2023-12-24 DIAGNOSIS — I2510 Atherosclerotic heart disease of native coronary artery without angina pectoris: Secondary | ICD-10-CM

## 2023-12-24 DIAGNOSIS — Z01810 Encounter for preprocedural cardiovascular examination: Secondary | ICD-10-CM

## 2023-12-24 DIAGNOSIS — I255 Ischemic cardiomyopathy: Secondary | ICD-10-CM

## 2023-12-24 DIAGNOSIS — I10 Essential (primary) hypertension: Secondary | ICD-10-CM

## 2023-12-24 NOTE — Patient Instructions (Addendum)
We need to get a better idea of what your blood pressure is running at home. Here are some instructions to follow: - I would recommend using a blood pressure cuff that goes on your arm. The wrist ones can be inaccurate. If you're purchasing one for the first time, try to select one that also reports your heart rate because this can be helpful information as well. - To check your blood pressure, choose a time at least 3 hours after taking your blood pressure medicines. If you can sample it at different times of the day, that's great - it might give you more information about how your blood pressure fluctuates. Remain seated in a chair for 5 minutes quietly beforehand, then check it.  - Please record a list of those readings and call us/send in MyChart message with them for our review on 12/31/23.  Medication Instructions:  Your physician recommends that you continue on your current medications as directed. Please refer to the Current Medication list given to you today.  *If you need a refill on your cardiac medications before your next appointment, please call your pharmacy*   Lab Work: NONE   If you have labs (blood work) drawn today and your tests are completely normal, you will receive your results only by: MyChart Message (if you have MyChart) OR A paper copy in the mail If you have any lab test that is abnormal or we need to change your treatment, we will call you to review the results.   Testing/Procedures: Your physician has requested that you have an echocardiogram. Echocardiography is a painless test that uses sound waves to create images of your heart. It provides your doctor with information about the size and shape of your heart and how well your heart's chambers and valves are working. This procedure takes approximately one hour. There are no restrictions for this procedure. Please do NOT wear cologne, perfume, aftershave, or lotions (deodorant is allowed). Please arrive 15 minutes  prior to your appointment time.  Please note: We ask at that you not bring children with you during ultrasound (echo/ vascular) testing. Due to room size and safety concerns, children are not allowed in the ultrasound rooms during exams. Our front office staff cannot provide observation of children in our lobby area while testing is being conducted. An adult accompanying a patient to their appointment will only be allowed in the ultrasound room at the discretion of the ultrasound technician under special circumstances. We apologize for any inconvenience.    Follow-Up: At [facility], you and your health needs are our priority.  As part of our continuing mission to provide you with exceptional heart care, we have created designated Provider Care Teams.  These Care Teams include your primary Cardiologist (physician) and Advanced Practice Providers (APPs -  Physician Assistants and Nurse Practitioners) who all work together to provide you with the care you need, when you need it.  We recommend signing up for the patient portal called "MyChart".  Sign up information is provided on this After Visit Summary.  MyChart is used to connect with patients for Virtual Visits (Telemedicine).  Patients are able to view lab/test results, encounter notes, upcoming appointments, etc.  Non-urgent messages can be sent to your provider as well.   To learn more about what you can do with MyChart, go to [email or web address].    Your next appointment:   6 month(s)  Provider:   You may see Dina Rich, MD or one of the following  Advanced Practice Providers on your designated Care Team:   Randall An, PA-C  Jacolyn Reedy, [place]     Other Instructions Thank you for choosing [facility]!

## 2024-01-08 ENCOUNTER — Ambulatory Visit (HOSPITAL_COMMUNITY)
Admission: RE | Admit: 2024-01-08 | Discharge: 2024-01-08 | Disposition: A | Discharge status: Home / Self Care | Payer: Managed Care, Other (non HMO) | Source: Ambulatory Visit | Attending: Physician Assistant | Admitting: Physician Assistant

## 2024-01-08 DIAGNOSIS — I255 Ischemic cardiomyopathy: Secondary | ICD-10-CM | POA: Insufficient documentation

## 2024-01-08 DIAGNOSIS — Z01810 Encounter for preprocedural cardiovascular examination: Secondary | ICD-10-CM | POA: Diagnosis present

## 2024-01-08 NOTE — Progress Notes (Signed)
*  PRELIMINARY RESULTS* Echocardiogram 2D Echocardiogram has been performed.  Stacey Drain 01/08/2024, 11:11 AM

## 2024-01-09 LAB — ECHOCARDIOGRAM COMPLETE
Area-P 1/2: 2.29 cm2
S' Lateral: 3.3 cm

## 2024-01-23 ENCOUNTER — Ambulatory Visit: Payer: Managed Care, Other (non HMO) | Admitting: Cardiology

## 2024-05-12 ENCOUNTER — Other Ambulatory Visit: Payer: Self-pay | Admitting: Cardiology

## 2024-05-28 ENCOUNTER — Encounter (INDEPENDENT_AMBULATORY_CARE_PROVIDER_SITE_OTHER): Payer: Self-pay | Admitting: *Deleted

## 2024-06-10 ENCOUNTER — Other Ambulatory Visit: Payer: Self-pay | Admitting: Cardiology

## 2024-08-10 ENCOUNTER — Other Ambulatory Visit: Payer: Self-pay | Admitting: Cardiology

## 2024-09-26 ENCOUNTER — Other Ambulatory Visit (HOSPITAL_COMMUNITY): Payer: Self-pay | Admitting: Nurse Practitioner

## 2024-09-26 DIAGNOSIS — R748 Abnormal levels of other serum enzymes: Secondary | ICD-10-CM

## 2024-10-09 ENCOUNTER — Ambulatory Visit (HOSPITAL_COMMUNITY)
Admission: RE | Admit: 2024-10-09 | Discharge: 2024-10-09 | Disposition: A | Discharge status: Home / Self Care | Source: Ambulatory Visit | Attending: Nurse Practitioner | Admitting: Nurse Practitioner

## 2024-10-09 DIAGNOSIS — R748 Abnormal levels of other serum enzymes: Secondary | ICD-10-CM | POA: Insufficient documentation

## 2024-11-22 ENCOUNTER — Other Ambulatory Visit: Payer: Self-pay | Admitting: Cardiology

## 2024-12-05 ENCOUNTER — Ambulatory Visit: Admitting: Cardiology

## 2024-12-17 ENCOUNTER — Other Ambulatory Visit: Payer: Self-pay | Admitting: Cardiology

## 2025-03-16 ENCOUNTER — Ambulatory Visit: Admitting: Cardiology
